# Patient Record
Sex: Male | Born: 1960 | Race: White | Hispanic: No | Marital: Single | Smoking: Current every day smoker
Health system: Southern US, Community
[De-identification: ages and names within clinical notes are randomized; demographics above are authoritative.]

## PROBLEM LIST (undated history)

## (undated) DIAGNOSIS — R109 Unspecified abdominal pain: Secondary | ICD-10-CM

## (undated) DIAGNOSIS — M1990 Unspecified osteoarthritis, unspecified site: Secondary | ICD-10-CM

## (undated) DIAGNOSIS — K859 Acute pancreatitis without necrosis or infection, unspecified: Secondary | ICD-10-CM

## (undated) DIAGNOSIS — G8929 Other chronic pain: Secondary | ICD-10-CM

## (undated) DIAGNOSIS — N289 Disorder of kidney and ureter, unspecified: Secondary | ICD-10-CM

## (undated) DIAGNOSIS — F329 Major depressive disorder, single episode, unspecified: Secondary | ICD-10-CM

## (undated) DIAGNOSIS — F32A Depression, unspecified: Secondary | ICD-10-CM

## (undated) HISTORY — PX: HERNIA REPAIR: SHX51

## (undated) HISTORY — PX: TOTAL KNEE ARTHROPLASTY: SHX125

## (undated) HISTORY — PX: FINGER AMPUTATION: SHX636

## (undated) HISTORY — PX: VARICOSE VEIN SURGERY: SHX832

## (undated) HISTORY — PX: MENISCUS REPAIR: SHX5179

## (undated) HISTORY — PX: KNEE SURGERY: SHX244

## (undated) SURGERY — EGD (ESOPHAGOGASTRODUODENOSCOPY)
Anesthesia: Moderate Sedation

---

## 2004-07-13 ENCOUNTER — Emergency Department (HOSPITAL_COMMUNITY): Admission: EM | Admit: 2004-07-13 | Discharge: 2004-07-13 | Payer: Self-pay | Admitting: Family Medicine

## 2005-05-08 ENCOUNTER — Emergency Department (HOSPITAL_COMMUNITY): Admission: EM | Admit: 2005-05-08 | Discharge: 2005-05-08 | Payer: Self-pay | Admitting: Emergency Medicine

## 2005-05-09 ENCOUNTER — Ambulatory Visit (HOSPITAL_COMMUNITY): Payer: Self-pay | Admitting: Orthopaedic Surgery

## 2005-05-09 ENCOUNTER — Encounter (HOSPITAL_COMMUNITY): Admission: RE | Admit: 2005-05-09 | Discharge: 2005-06-08 | Payer: Self-pay | Admitting: Orthopaedic Surgery

## 2005-05-09 ENCOUNTER — Ambulatory Visit (HOSPITAL_COMMUNITY): Admission: RE | Admit: 2005-05-09 | Discharge: 2005-05-09 | Payer: Self-pay | Admitting: Orthopaedic Surgery

## 2005-09-01 ENCOUNTER — Ambulatory Visit (HOSPITAL_COMMUNITY): Admission: RE | Admit: 2005-09-01 | Discharge: 2005-09-01 | Payer: Self-pay | Admitting: Orthopaedic Surgery

## 2005-09-26 ENCOUNTER — Ambulatory Visit (HOSPITAL_COMMUNITY): Admission: RE | Admit: 2005-09-26 | Discharge: 2005-09-26 | Payer: Self-pay | Admitting: Orthopaedic Surgery

## 2005-10-14 ENCOUNTER — Emergency Department (HOSPITAL_COMMUNITY): Admission: EM | Admit: 2005-10-14 | Discharge: 2005-10-15 | Payer: Self-pay | Admitting: *Deleted

## 2005-10-24 ENCOUNTER — Encounter (INDEPENDENT_AMBULATORY_CARE_PROVIDER_SITE_OTHER): Payer: Self-pay | Admitting: Orthopaedic Surgery

## 2005-10-24 ENCOUNTER — Ambulatory Visit (HOSPITAL_COMMUNITY): Admission: RE | Admit: 2005-10-24 | Discharge: 2005-10-24 | Payer: Self-pay | Admitting: Orthopaedic Surgery

## 2005-11-01 ENCOUNTER — Inpatient Hospital Stay (HOSPITAL_COMMUNITY): Admission: AD | Admit: 2005-11-01 | Discharge: 2005-11-02 | Payer: Self-pay | Admitting: Orthopaedic Surgery

## 2005-11-11 ENCOUNTER — Emergency Department (HOSPITAL_COMMUNITY): Admission: EM | Admit: 2005-11-11 | Discharge: 2005-11-11 | Payer: Self-pay | Admitting: *Deleted

## 2005-11-16 ENCOUNTER — Encounter (INDEPENDENT_AMBULATORY_CARE_PROVIDER_SITE_OTHER): Payer: Self-pay | Admitting: Orthopaedic Surgery

## 2005-11-16 ENCOUNTER — Ambulatory Visit (HOSPITAL_COMMUNITY): Admission: RE | Admit: 2005-11-16 | Discharge: 2005-11-16 | Payer: Self-pay | Admitting: Orthopaedic Surgery

## 2005-11-21 ENCOUNTER — Emergency Department (HOSPITAL_COMMUNITY): Admission: EM | Admit: 2005-11-21 | Discharge: 2005-11-21 | Payer: Self-pay | Admitting: Emergency Medicine

## 2005-11-22 ENCOUNTER — Ambulatory Visit (HOSPITAL_COMMUNITY): Admission: RE | Admit: 2005-11-22 | Discharge: 2005-11-22 | Payer: Self-pay | Admitting: Orthopaedic Surgery

## 2005-11-27 ENCOUNTER — Ambulatory Visit: Payer: Self-pay | Admitting: *Deleted

## 2005-11-27 ENCOUNTER — Emergency Department (HOSPITAL_COMMUNITY): Admission: EM | Admit: 2005-11-27 | Discharge: 2005-11-27 | Payer: Self-pay | Admitting: Emergency Medicine

## 2005-11-29 ENCOUNTER — Ambulatory Visit: Payer: Self-pay | Admitting: *Deleted

## 2005-12-01 ENCOUNTER — Ambulatory Visit: Payer: Self-pay | Admitting: Internal Medicine

## 2005-12-04 ENCOUNTER — Emergency Department (HOSPITAL_COMMUNITY): Admission: EM | Admit: 2005-12-04 | Discharge: 2005-12-04 | Payer: Self-pay | Admitting: Emergency Medicine

## 2005-12-08 ENCOUNTER — Ambulatory Visit: Payer: Self-pay | Admitting: *Deleted

## 2005-12-22 ENCOUNTER — Ambulatory Visit: Payer: Self-pay | Admitting: Cardiology

## 2006-01-08 ENCOUNTER — Ambulatory Visit: Payer: Self-pay | Admitting: *Deleted

## 2006-01-10 ENCOUNTER — Ambulatory Visit: Payer: Self-pay | Admitting: *Deleted

## 2006-01-17 ENCOUNTER — Ambulatory Visit: Payer: Self-pay | Admitting: *Deleted

## 2006-02-07 ENCOUNTER — Ambulatory Visit: Payer: Self-pay | Admitting: Cardiology

## 2006-02-07 ENCOUNTER — Ambulatory Visit (HOSPITAL_COMMUNITY): Admission: RE | Admit: 2006-02-07 | Discharge: 2006-02-07 | Payer: Self-pay | Admitting: Orthopaedic Surgery

## 2006-02-14 ENCOUNTER — Ambulatory Visit: Payer: Self-pay | Admitting: *Deleted

## 2006-02-25 ENCOUNTER — Emergency Department (HOSPITAL_COMMUNITY): Admission: EM | Admit: 2006-02-25 | Discharge: 2006-02-25 | Payer: Self-pay | Admitting: Emergency Medicine

## 2006-02-28 ENCOUNTER — Inpatient Hospital Stay (HOSPITAL_COMMUNITY): Admission: AD | Admit: 2006-02-28 | Discharge: 2006-03-02 | Payer: Self-pay | Admitting: Orthopaedic Surgery

## 2006-03-16 ENCOUNTER — Ambulatory Visit: Payer: Self-pay | Admitting: *Deleted

## 2006-03-20 ENCOUNTER — Emergency Department (HOSPITAL_COMMUNITY): Admission: EM | Admit: 2006-03-20 | Discharge: 2006-03-20 | Payer: Self-pay | Admitting: Emergency Medicine

## 2006-03-28 ENCOUNTER — Ambulatory Visit: Payer: Self-pay | Admitting: *Deleted

## 2006-03-28 ENCOUNTER — Ambulatory Visit: Payer: Self-pay | Admitting: Family Medicine

## 2006-03-29 ENCOUNTER — Emergency Department (HOSPITAL_COMMUNITY): Admission: EM | Admit: 2006-03-29 | Discharge: 2006-03-29 | Payer: Self-pay | Admitting: Emergency Medicine

## 2006-03-30 ENCOUNTER — Ambulatory Visit: Payer: Self-pay | Admitting: Family Medicine

## 2006-04-20 ENCOUNTER — Ambulatory Visit: Payer: Self-pay | Admitting: *Deleted

## 2006-05-02 ENCOUNTER — Emergency Department (HOSPITAL_COMMUNITY): Admission: EM | Admit: 2006-05-02 | Discharge: 2006-05-02 | Payer: Self-pay | Admitting: Emergency Medicine

## 2006-05-09 ENCOUNTER — Emergency Department (HOSPITAL_COMMUNITY): Admission: EM | Admit: 2006-05-09 | Discharge: 2006-05-09 | Payer: Self-pay | Admitting: Emergency Medicine

## 2006-08-26 ENCOUNTER — Emergency Department (HOSPITAL_COMMUNITY): Admission: EM | Admit: 2006-08-26 | Discharge: 2006-08-26 | Payer: Self-pay | Admitting: Pediatric Gastroenterology

## 2006-10-09 IMAGING — CR DG KNEE COMPLETE 4+V*L*
4 series · 4 of 4 positions shown · non-contrast
Comparison: none

CLINICAL DATA: Fell off ladder, back pain, knee pain

LUMBAR SPINE - 4  VIEW:

[view not recorded (1 of 4)]
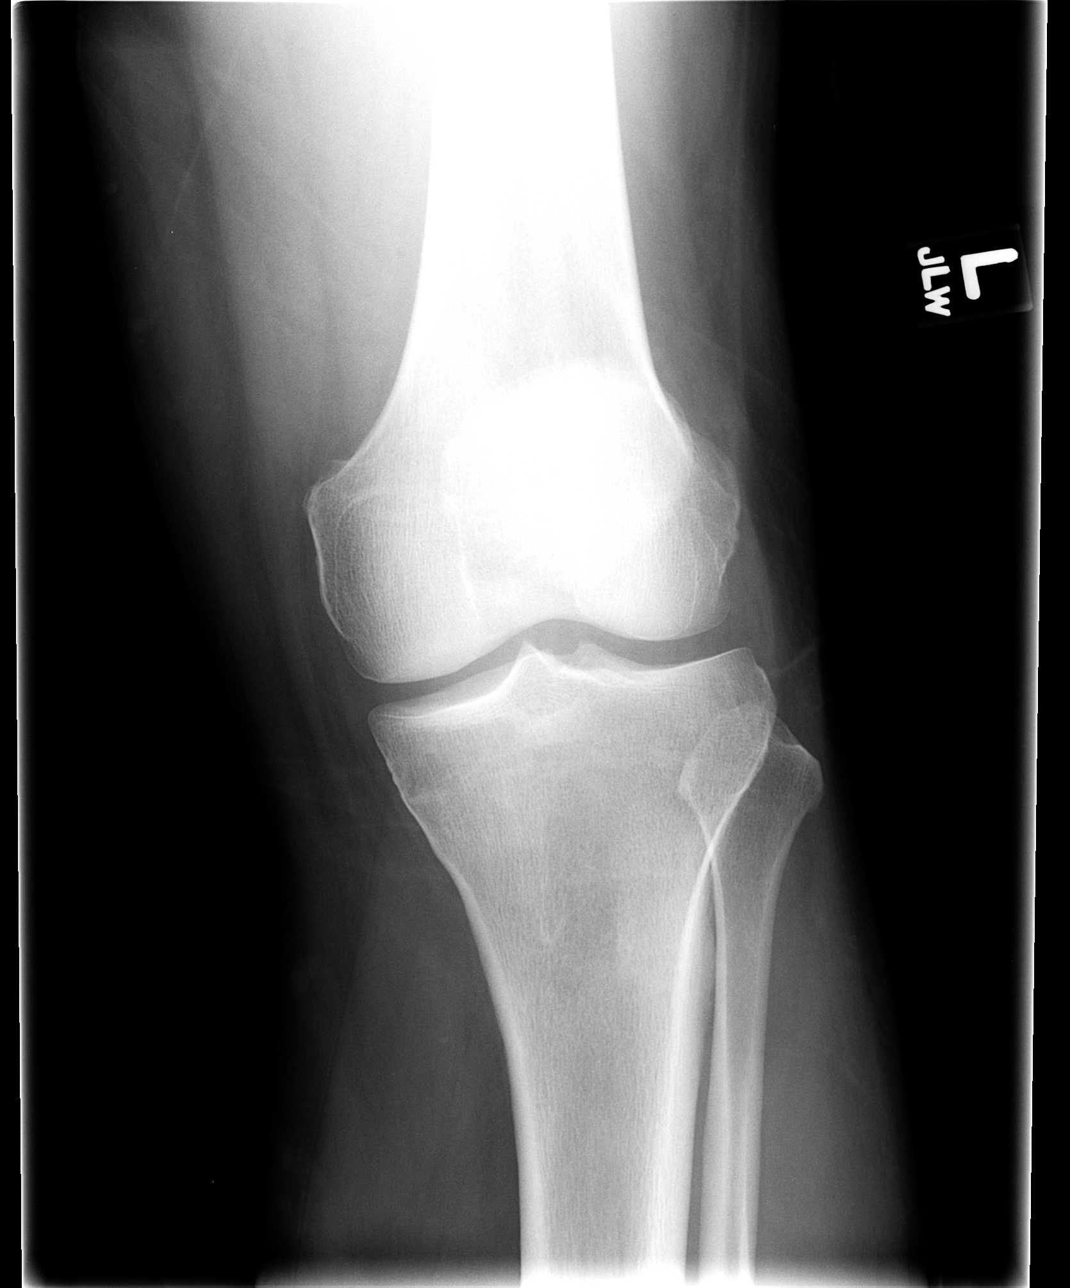

[view not recorded (2 of 4)]
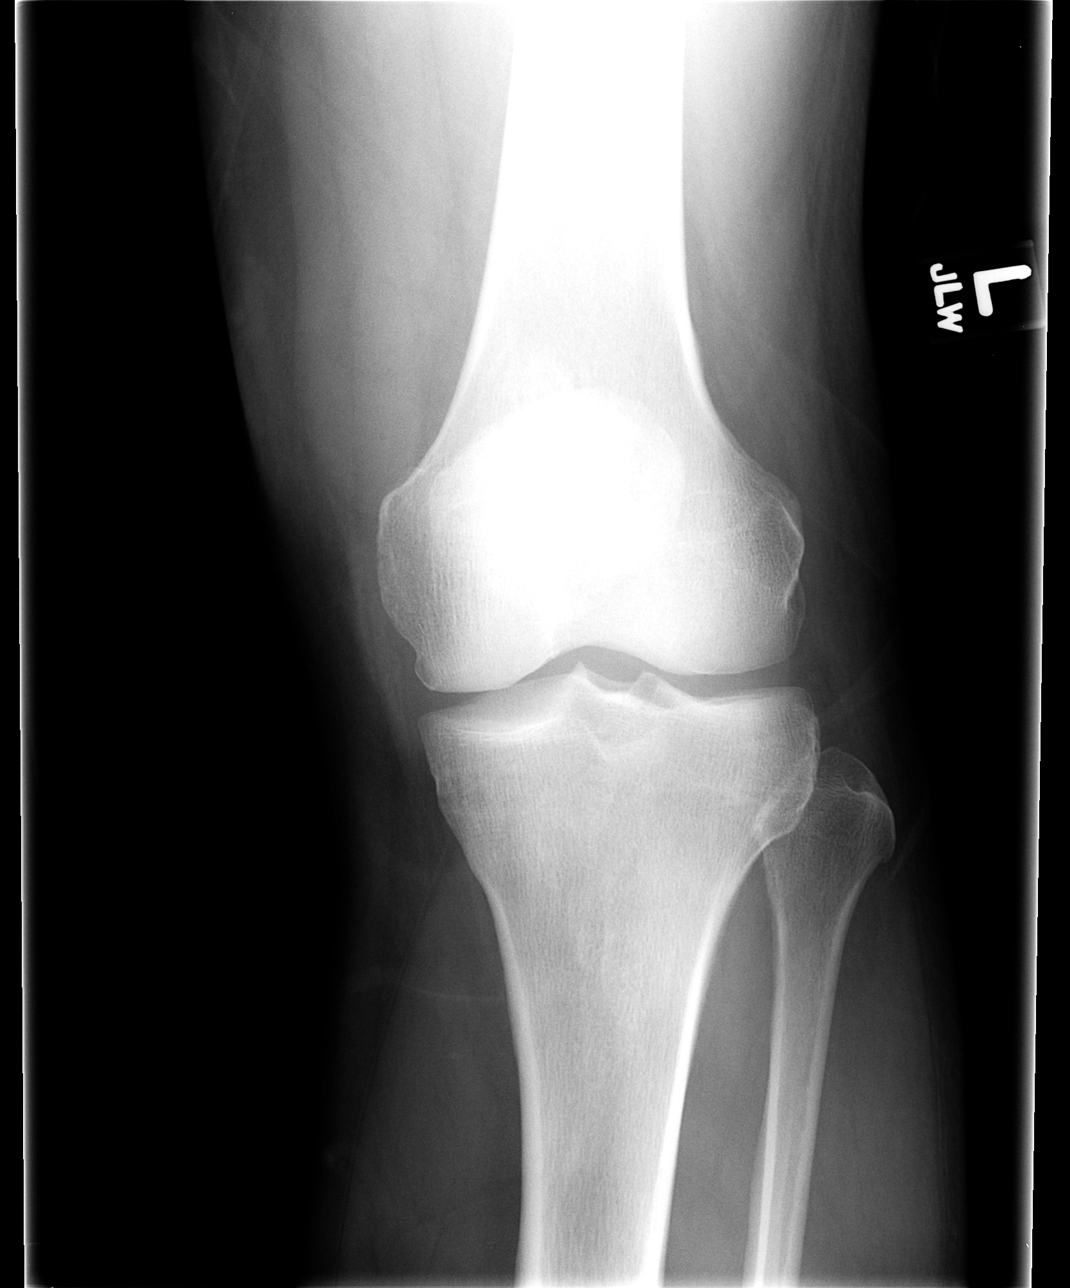

[view not recorded (3 of 4)]
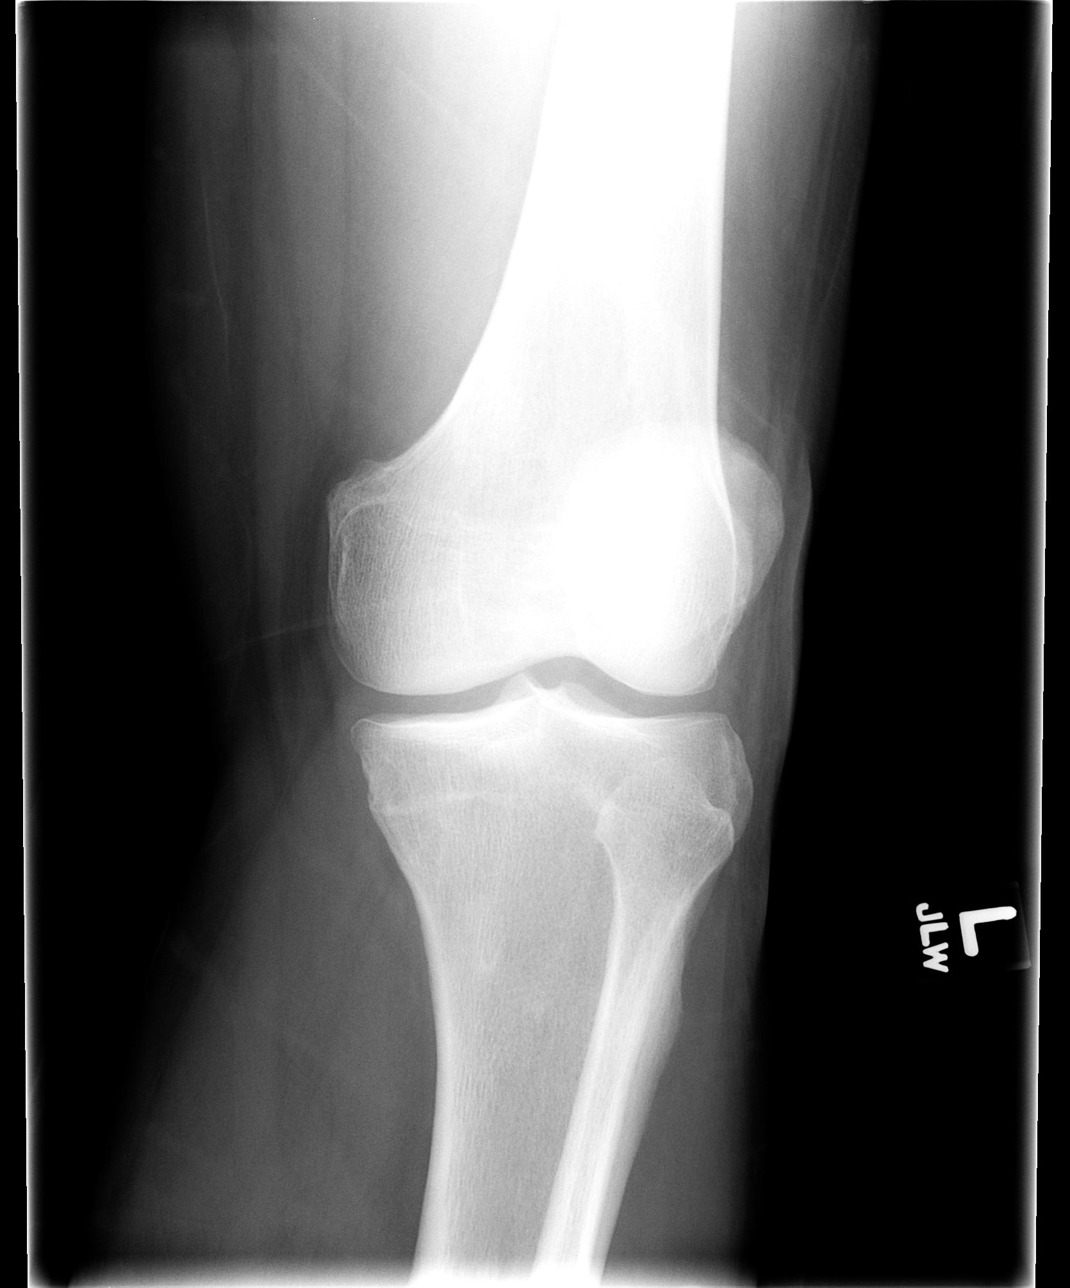

[view not recorded (4 of 4)]
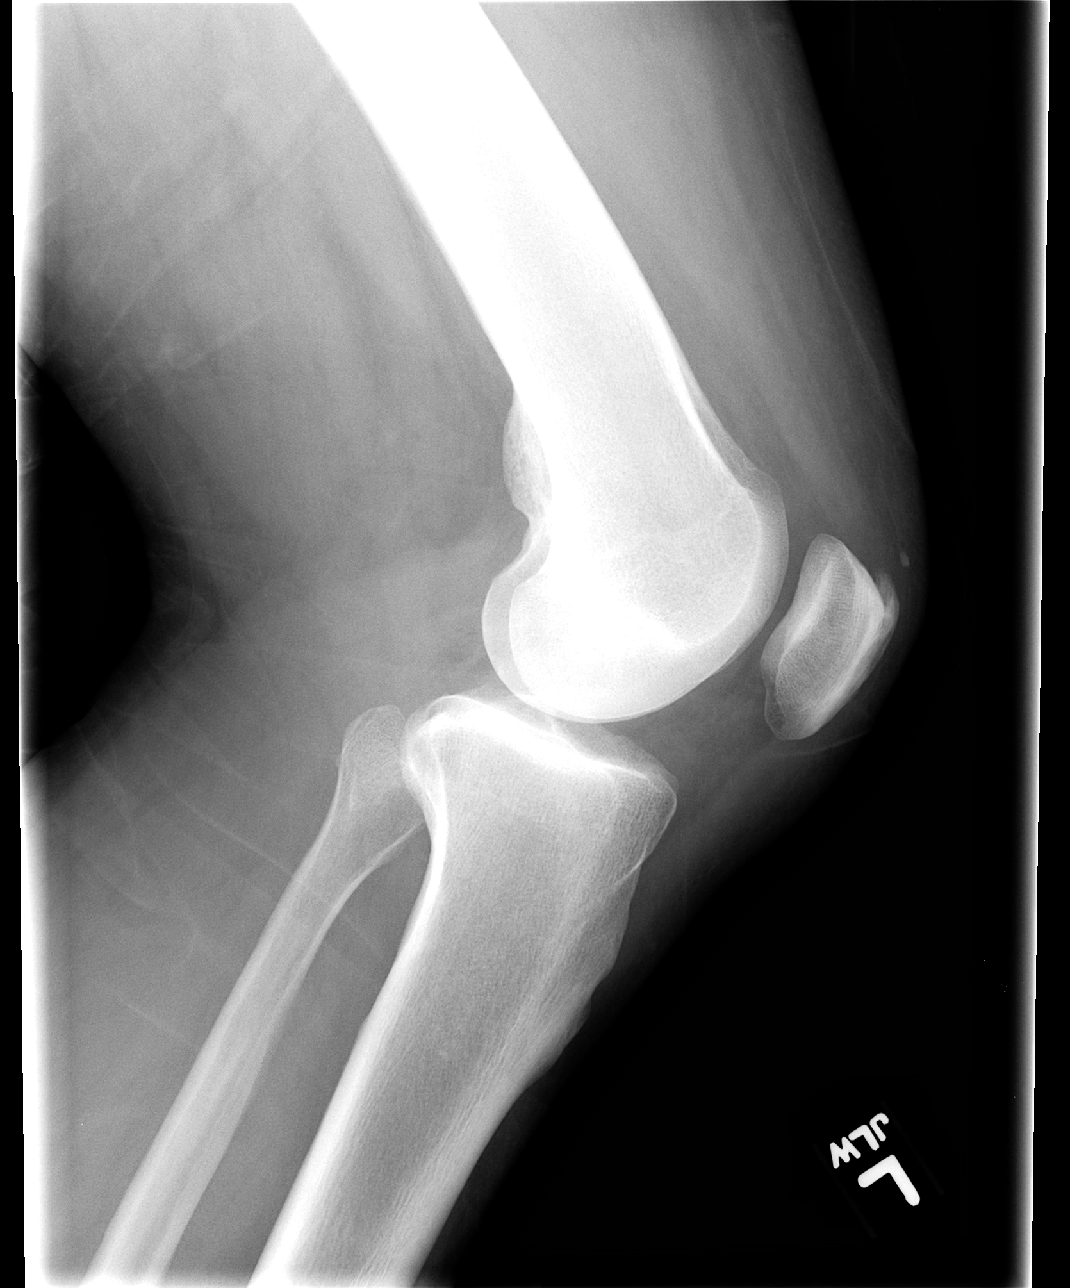

[4 of 4 positions shown; findings below may reference images not displayed]

FINDINGS: There is no evidence of lumbar spine fracture.  Alignment is normal. 
Intervertebral disc spaces are maintained, and no other significant bone
abnormalities are identified.
IMPRESSION: Negative lumbar spine radiographs.

THORACIC SPINE - 2  VIEW:
FINDINGS: There is no evidence of thoracic spine fracture.  Alignment is
normal.  No othersignificant bone abnormalities are identified.
IMPRESSION: Negative thoracic spine radiographs.

RIGHT KNEE - 4 VIEW
FINDINGS: Minimal degenerative changes in the medial compartment. No fracture,
subluxation, or dislocation. No joint effusion.

IMPRESSION

Minimal degenerative changes. No acute findings.

LEFT KNEE - 4 VIEW
FINDINGS: No acute bony abnormality. Specifically, no fracture, subluxation,
dislocation, or joint effusion.

IMPRESSION

No acute findings.

## 2006-10-09 IMAGING — CR DG KNEE COMPLETE 4+V*R*
4 series · 4 of 4 positions shown · non-contrast
Comparison: none

CLINICAL DATA: Fell off ladder, back pain, knee pain

LUMBAR SPINE - 4  VIEW:

[view not recorded (1 of 4)]
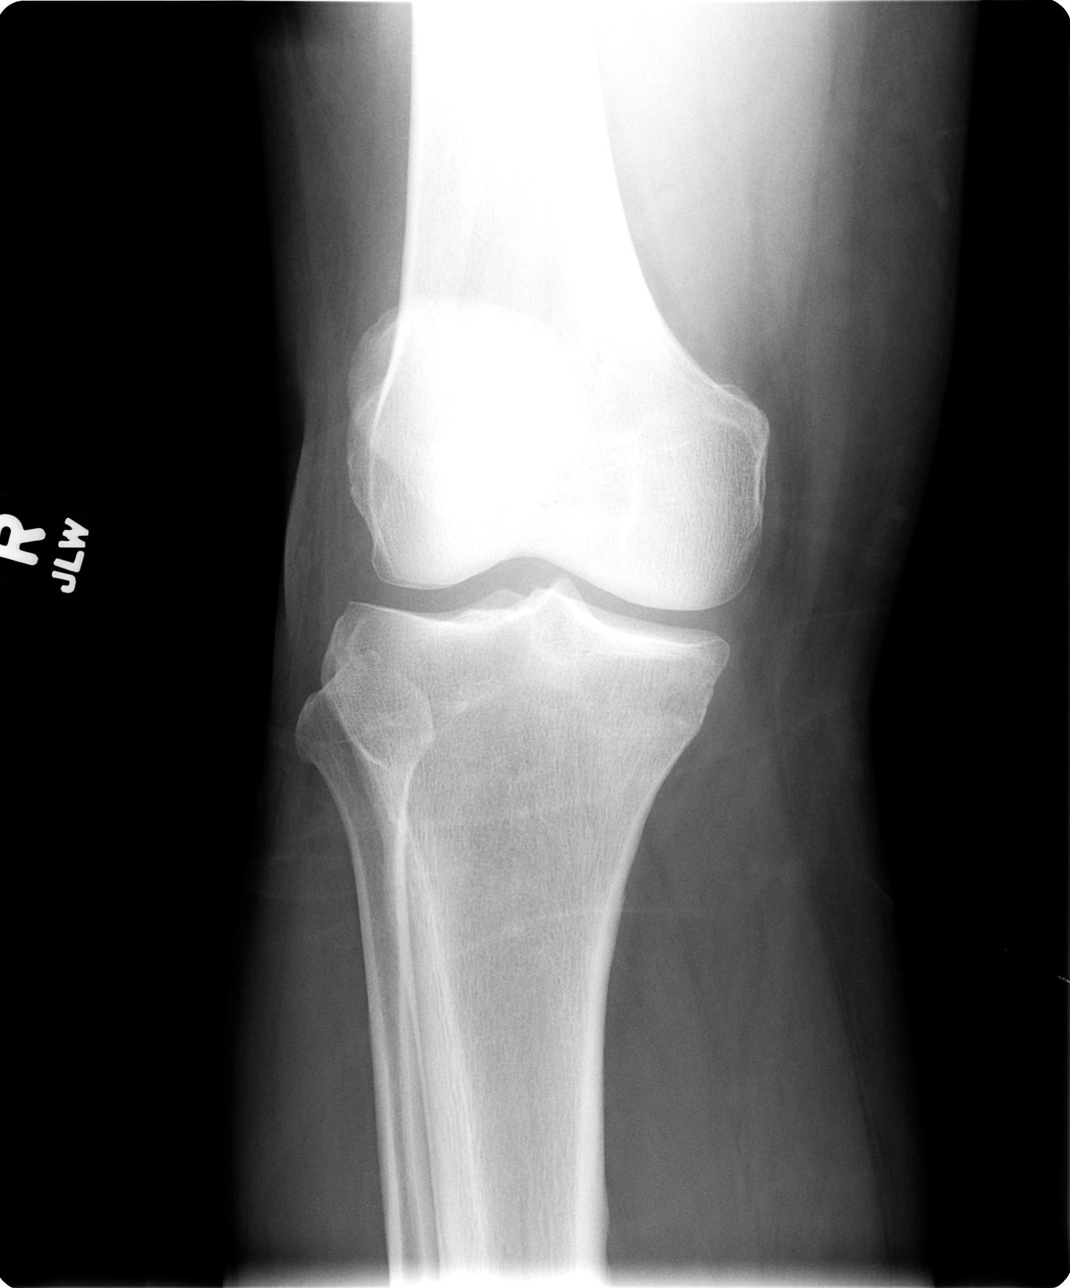

[view not recorded (2 of 4)]
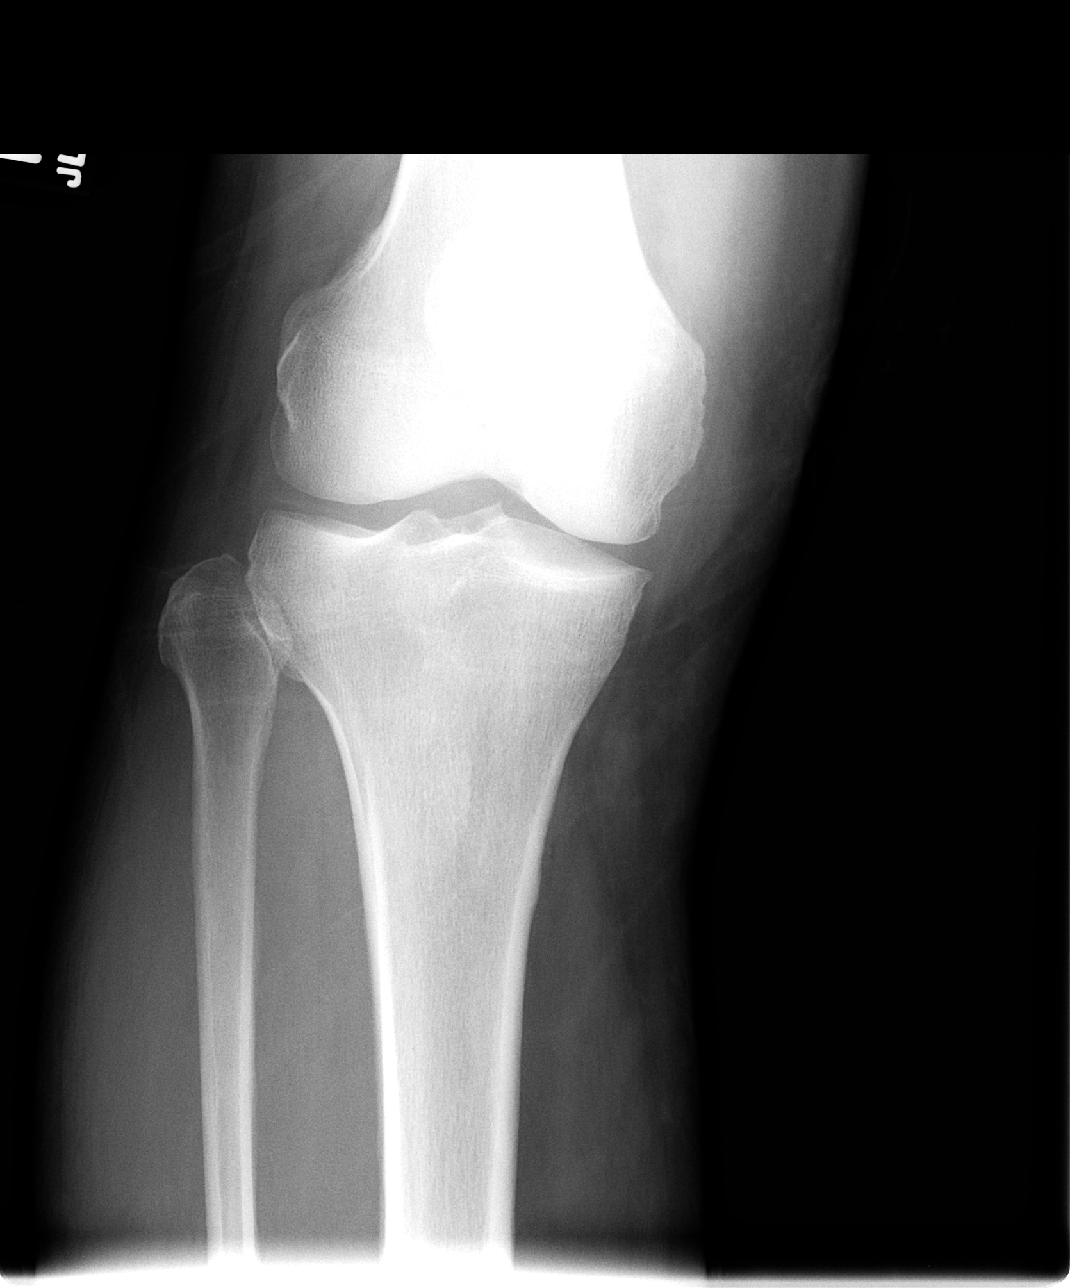

[view not recorded (3 of 4)]
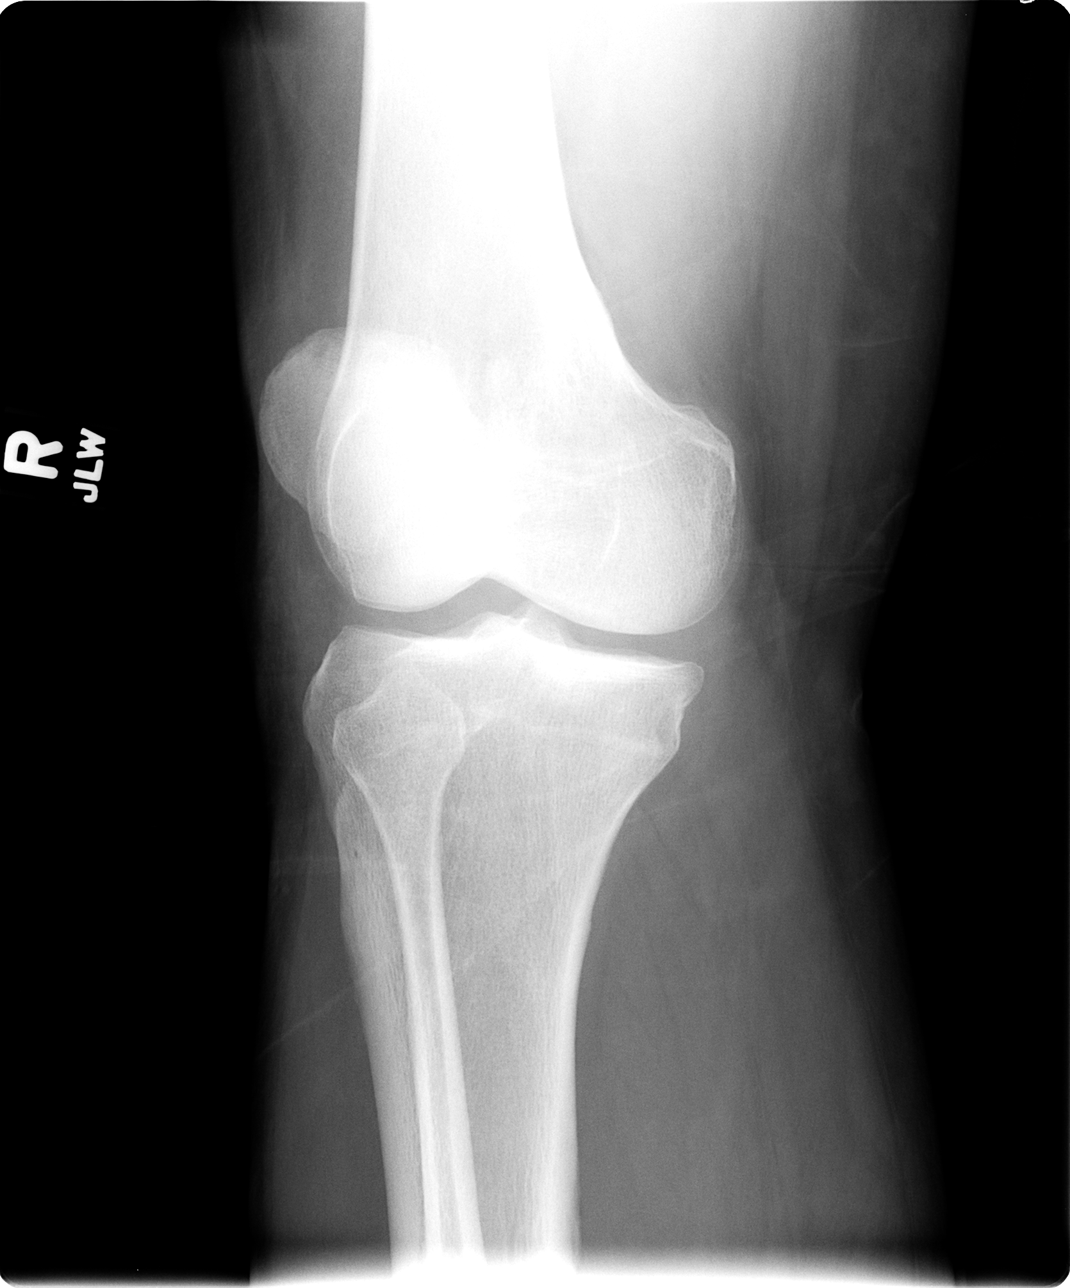

[view not recorded (4 of 4)]
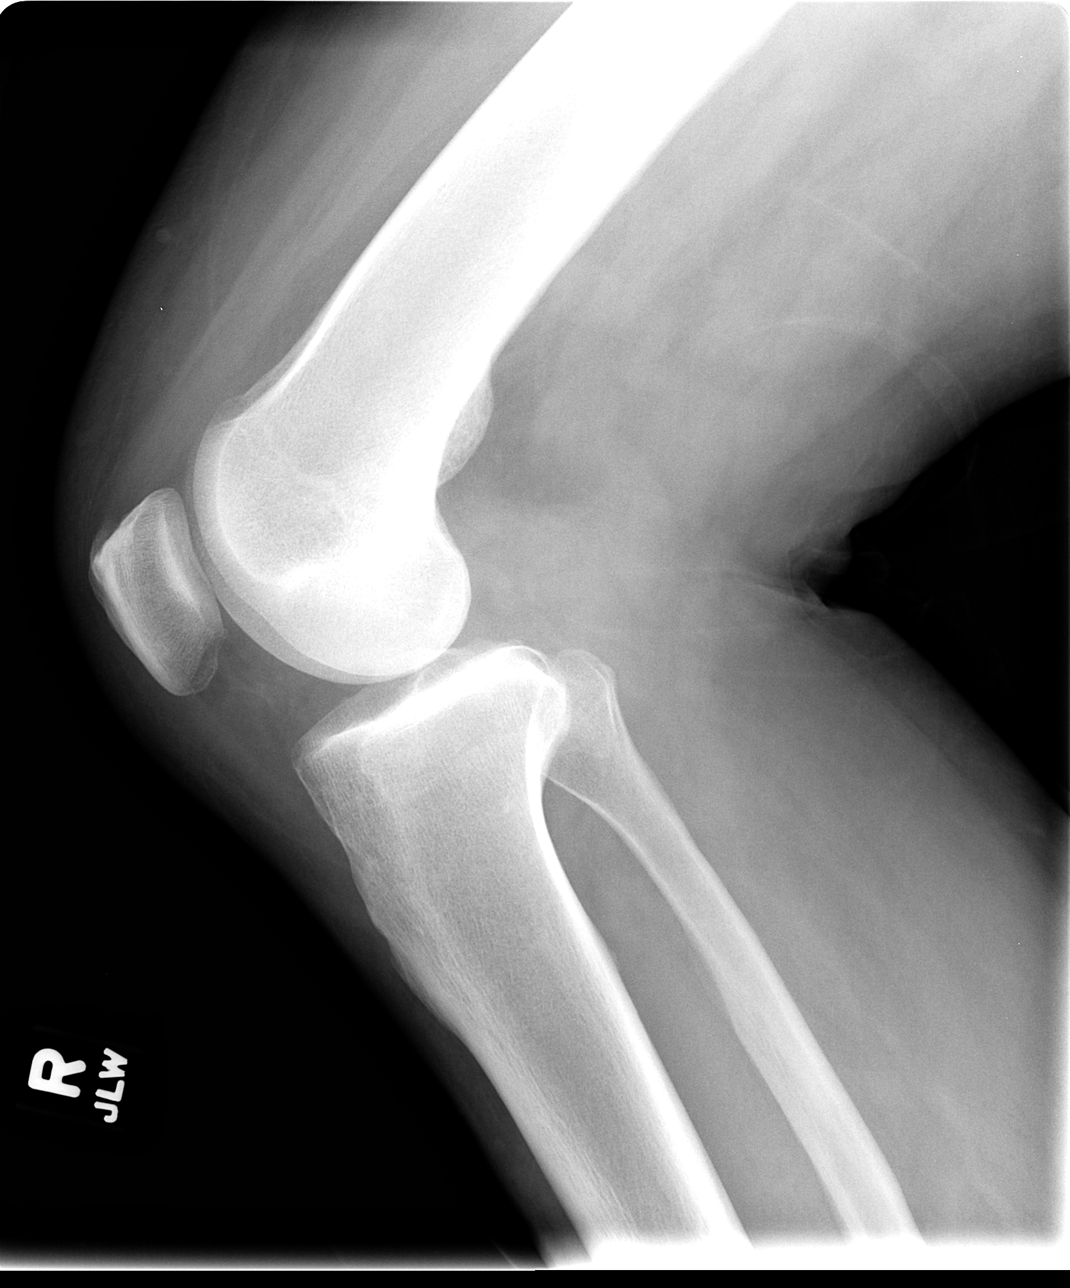

[4 of 4 positions shown; findings below may reference images not displayed]

FINDINGS: There is no evidence of lumbar spine fracture.  Alignment is normal. 
Intervertebral disc spaces are maintained, and no other significant bone
abnormalities are identified.
IMPRESSION: Negative lumbar spine radiographs.

THORACIC SPINE - 2  VIEW:
FINDINGS: There is no evidence of thoracic spine fracture.  Alignment is
normal.  No othersignificant bone abnormalities are identified.
IMPRESSION: Negative thoracic spine radiographs.

RIGHT KNEE - 4 VIEW
FINDINGS: Minimal degenerative changes in the medial compartment. No fracture,
subluxation, or dislocation. No joint effusion.

IMPRESSION

Minimal degenerative changes. No acute findings.

LEFT KNEE - 4 VIEW
FINDINGS: No acute bony abnormality. Specifically, no fracture, subluxation,
dislocation, or joint effusion.

IMPRESSION

No acute findings.

## 2007-01-13 ENCOUNTER — Emergency Department (HOSPITAL_COMMUNITY): Admission: EM | Admit: 2007-01-13 | Discharge: 2007-01-13 | Payer: Self-pay | Admitting: Emergency Medicine

## 2007-02-23 ENCOUNTER — Emergency Department (HOSPITAL_COMMUNITY): Admission: EM | Admit: 2007-02-23 | Discharge: 2007-02-23 | Payer: Self-pay | Admitting: Emergency Medicine

## 2007-04-26 ENCOUNTER — Encounter (INDEPENDENT_AMBULATORY_CARE_PROVIDER_SITE_OTHER): Payer: Self-pay | Admitting: Family Medicine

## 2007-05-12 ENCOUNTER — Emergency Department (HOSPITAL_COMMUNITY): Admission: EM | Admit: 2007-05-12 | Discharge: 2007-05-12 | Payer: Self-pay | Admitting: Emergency Medicine

## 2008-11-29 ENCOUNTER — Emergency Department (HOSPITAL_COMMUNITY): Admission: EM | Admit: 2008-11-29 | Discharge: 2008-11-29 | Payer: Self-pay | Admitting: Emergency Medicine

## 2009-08-26 ENCOUNTER — Encounter (INDEPENDENT_AMBULATORY_CARE_PROVIDER_SITE_OTHER): Payer: Self-pay | Admitting: Family Medicine

## 2009-12-19 ENCOUNTER — Emergency Department (HOSPITAL_COMMUNITY): Admission: EM | Admit: 2009-12-19 | Discharge: 2009-12-19 | Payer: Self-pay | Admitting: Emergency Medicine

## 2009-12-21 ENCOUNTER — Ambulatory Visit (HOSPITAL_COMMUNITY): Admission: RE | Admit: 2009-12-21 | Discharge: 2009-12-21 | Payer: Self-pay | Admitting: Orthopaedic Surgery

## 2010-03-03 ENCOUNTER — Emergency Department (HOSPITAL_COMMUNITY): Admission: EM | Admit: 2010-03-03 | Discharge: 2010-03-03 | Payer: Self-pay | Admitting: Emergency Medicine

## 2010-05-03 ENCOUNTER — Emergency Department (HOSPITAL_COMMUNITY): Admission: EM | Admit: 2010-05-03 | Discharge: 2010-05-03 | Payer: Self-pay | Admitting: Emergency Medicine

## 2010-05-22 ENCOUNTER — Emergency Department (HOSPITAL_COMMUNITY): Admission: EM | Admit: 2010-05-22 | Discharge: 2010-05-22 | Payer: Self-pay | Admitting: Emergency Medicine

## 2010-07-27 ENCOUNTER — Emergency Department (HOSPITAL_COMMUNITY): Admission: EM | Admit: 2010-07-27 | Discharge: 2010-07-27 | Payer: Self-pay | Admitting: Emergency Medicine

## 2010-08-17 ENCOUNTER — Emergency Department (HOSPITAL_COMMUNITY): Admission: EM | Admit: 2010-08-17 | Discharge: 2010-08-17 | Payer: Self-pay | Admitting: Emergency Medicine

## 2010-11-01 ENCOUNTER — Inpatient Hospital Stay (HOSPITAL_COMMUNITY): Admission: EM | Admit: 2010-11-01 | Discharge: 2010-11-02 | Payer: Self-pay | Source: Home / Self Care

## 2010-12-07 ENCOUNTER — Emergency Department (HOSPITAL_COMMUNITY)
Admission: EM | Admit: 2010-12-07 | Discharge: 2010-12-07 | Payer: Self-pay | Source: Home / Self Care | Admitting: Emergency Medicine

## 2010-12-10 ENCOUNTER — Encounter: Payer: Self-pay | Admitting: Orthopaedic Surgery

## 2010-12-11 ENCOUNTER — Encounter: Payer: Self-pay | Admitting: Orthopaedic Surgery

## 2011-01-30 LAB — BASIC METABOLIC PANEL
BUN: 16 mg/dL (ref 6–23)
GFR calc non Af Amer: >60 mL/min (ref >60)
Glucose, Bld: 201 mg/dL — ABNORMAL HIGH (ref 70–99)
Potassium: 4.1 mEq/L (ref 3.5–5.1)
Sodium: 138 mEq/L (ref 135–145)

## 2011-01-30 LAB — RAPID URINE DRUG SCREEN, HOSP PERFORMED
Amphetamines: NOT DETECTED
Barbiturates: NOT DETECTED
Opiates: NOT DETECTED

## 2011-01-30 LAB — CBC
HCT: 39.3 % (ref 39.0–52.0)
Hemoglobin: 13.3 g/dL (ref 13.0–17.0)
MCH: 28.2 pg (ref 26.0–34.0)
MCHC: 33.8 g/dL (ref 30.0–36.0)
MCV: 83.4 fL (ref 78.0–100.0)
RBC: 4.29 MIL/uL (ref 4.22–5.81)
RBC: 4.71 MIL/uL (ref 4.22–5.81)
RDW: 15.3 % (ref 11.5–15.5)
WBC: 8.3 10*3/uL (ref 4.0–10.5)

## 2011-01-30 LAB — URINE CULTURE
Colony Count: NO GROWTH
Culture  Setup Time: 201112140137
Culture: NO GROWTH

## 2011-01-30 LAB — DIFFERENTIAL
Basophils Absolute: 0.1 10*3/uL (ref 0.0–0.1)
Eosinophils Absolute: 0.4 10*3/uL (ref 0.0–0.7)
Eosinophils Absolute: 0.4 10*3/uL (ref 0.0–0.7)
Eosinophils Relative: 5 % (ref 0–5)
Lymphocytes Relative: 34 % (ref 12–46)
Lymphs Abs: 3.3 10*3/uL (ref 0.7–4.0)
Monocytes Absolute: 0.4 10*3/uL (ref 0.1–1.0)
Monocytes Relative: 5 % (ref 3–12)
Neutrophils Relative %: 55 % (ref 43–77)

## 2011-01-30 LAB — URINE MICROSCOPIC-ADD ON: Urine-Other: NONE SEEN

## 2011-01-30 LAB — COMPREHENSIVE METABOLIC PANEL
ALT: 27 U/L (ref 0–53)
AST: 19 U/L (ref 0–37)
Alkaline Phosphatase: 107 U/L (ref 39–117)
CO2: 24 mEq/L (ref 19–32)
Chloride: 101 mEq/L (ref 96–112)
GFR calc Af Amer: >60 mL/min (ref >60)
Total Bilirubin: 0.3 mg/dL (ref 0.3–1.2)

## 2011-01-30 LAB — URINALYSIS, ROUTINE W REFLEX MICROSCOPIC
Bilirubin Urine: NEGATIVE
Glucose, UA: >1000 mg/dL — AB
Hgb urine dipstick: NEGATIVE
Ketones, ur: NEGATIVE mg/dL
Leukocytes, UA: NEGATIVE
Urobilinogen, UA: 0.2 mg/dL (ref 0.0–1.0)

## 2011-01-30 LAB — GLUCOSE, CAPILLARY
Glucose-Capillary: 214 mg/dL — ABNORMAL HIGH (ref 70–99)
Glucose-Capillary: 265 mg/dL — ABNORMAL HIGH (ref 70–99)
Glucose-Capillary: 284 mg/dL — ABNORMAL HIGH (ref 70–99)

## 2011-01-30 LAB — HEMOGLOBIN A1C: Hgb A1c MFr Bld: 8.7 % — ABNORMAL HIGH (ref <5.7)

## 2011-02-02 LAB — GLUCOSE, CAPILLARY: Glucose-Capillary: 196 mg/dL — ABNORMAL HIGH (ref 70–99)

## 2011-04-05 ENCOUNTER — Observation Stay (HOSPITAL_COMMUNITY)
Admission: AD | Admit: 2011-04-05 | Discharge: 2011-04-06 | Disposition: A | Discharge status: Home / Self Care | Payer: BC Managed Care – PPO | Source: Ambulatory Visit | Attending: Internal Medicine | Admitting: Internal Medicine

## 2011-04-05 DIAGNOSIS — F988 Other specified behavioral and emotional disorders with onset usually occurring in childhood and adolescence: Secondary | ICD-10-CM | POA: Insufficient documentation

## 2011-04-05 DIAGNOSIS — IMO0002 Reserved for concepts with insufficient information to code with codable children: Secondary | ICD-10-CM | POA: Insufficient documentation

## 2011-04-05 DIAGNOSIS — Z79899 Other long term (current) drug therapy: Secondary | ICD-10-CM | POA: Insufficient documentation

## 2011-04-05 DIAGNOSIS — F17200 Nicotine dependence, unspecified, uncomplicated: Secondary | ICD-10-CM | POA: Insufficient documentation

## 2011-04-05 DIAGNOSIS — E119 Type 2 diabetes mellitus without complications: Secondary | ICD-10-CM | POA: Insufficient documentation

## 2011-04-05 DIAGNOSIS — I10 Essential (primary) hypertension: Secondary | ICD-10-CM | POA: Insufficient documentation

## 2011-04-05 DIAGNOSIS — F319 Bipolar disorder, unspecified: Secondary | ICD-10-CM | POA: Insufficient documentation

## 2011-04-05 DIAGNOSIS — G4700 Insomnia, unspecified: Principal | ICD-10-CM | POA: Insufficient documentation

## 2011-04-05 DIAGNOSIS — M7989 Other specified soft tissue disorders: Secondary | ICD-10-CM | POA: Insufficient documentation

## 2011-04-05 DIAGNOSIS — M1710 Unilateral primary osteoarthritis, unspecified knee: Secondary | ICD-10-CM | POA: Insufficient documentation

## 2011-04-05 LAB — RAPID URINE DRUG SCREEN, HOSP PERFORMED
Amphetamines: POSITIVE — AB
Barbiturates: NOT DETECTED
Benzodiazepines: NOT DETECTED
Cocaine: NOT DETECTED
Opiates: NOT DETECTED

## 2011-04-06 ENCOUNTER — Observation Stay (HOSPITAL_COMMUNITY): Payer: BC Managed Care – PPO

## 2011-04-06 LAB — URINALYSIS, ROUTINE W REFLEX MICROSCOPIC
Bilirubin Urine: NEGATIVE
Glucose, UA: 500 mg/dL — AB
Ketones, ur: NEGATIVE mg/dL
Protein, ur: NEGATIVE mg/dL
Urobilinogen, UA: 0.2 mg/dL (ref 0.0–1.0)

## 2011-04-06 LAB — D-DIMER, QUANTITATIVE: D-Dimer, Quant: 1.31 ug/mL-FEU — ABNORMAL HIGH (ref 0.00–0.48)

## 2011-04-06 LAB — BASIC METABOLIC PANEL
CO2: 30 mEq/L (ref 19–32)
CO2: 29 mEq/L (ref 19–32)
Calcium: 9.3 mg/dL (ref 8.4–10.5)
Calcium: 9.1 mg/dL (ref 8.4–10.5)
Creatinine, Ser: 0.55 mg/dL (ref 0.4–1.5)
GFR calc Af Amer: >60 mL/min (ref >60)
GFR calc Af Amer: >60 mL/min (ref >60)
GFR calc non Af Amer: >60 mL/min (ref >60)
Potassium: 3.4 mEq/L — ABNORMAL LOW (ref 3.5–5.1)
Sodium: 136 mEq/L (ref 135–145)

## 2011-04-06 LAB — DIFFERENTIAL
Basophils Relative: 1 % (ref 0–1)
Eosinophils Absolute: 0.2 10*3/uL (ref 0.0–0.7)
Eosinophils Absolute: 0.2 10*3/uL (ref 0.0–0.7)
Lymphs Abs: 1.6 10*3/uL (ref 0.7–4.0)
Monocytes Absolute: 0.4 10*3/uL (ref 0.1–1.0)
Monocytes Relative: 8 % (ref 3–12)
Neutro Abs: 3.2 10*3/uL (ref 1.7–7.7)
Neutrophils Relative %: 59 % (ref 43–77)

## 2011-04-06 LAB — CBC
Hemoglobin: 11.9 g/dL — ABNORMAL LOW (ref 13.0–17.0)
MCH: 27.6 pg (ref 26.0–34.0)
MCHC: 32.5 g/dL (ref 30.0–36.0)
MCV: 83.5 fL (ref 78.0–100.0)
Platelets: 202 10*3/uL (ref 150–400)
Platelets: 176 10*3/uL (ref 150–400)
RBC: 4.3 MIL/uL (ref 4.22–5.81)
RDW: 15.6 % — ABNORMAL HIGH (ref 11.5–15.5)
WBC: 5.4 10*3/uL (ref 4.0–10.5)

## 2011-04-06 LAB — GLUCOSE, CAPILLARY
Glucose-Capillary: 203 mg/dL — ABNORMAL HIGH (ref 70–99)
Glucose-Capillary: 127 mg/dL — ABNORMAL HIGH (ref 70–99)
Glucose-Capillary: 140 mg/dL — ABNORMAL HIGH (ref 70–99)

## 2011-04-06 LAB — T4, FREE: Free T4: 1.02 ng/dL (ref 0.80–1.80)

## 2011-04-06 LAB — URINE MICROSCOPIC-ADD ON

## 2011-04-06 LAB — ACETAMINOPHEN LEVEL: Acetaminophen (Tylenol), Serum: <15 ug/mL (ref 10–30)

## 2011-04-07 NOTE — H&P (Signed)
NAMERUTHVIK, BARNABY NO.:  [id number]  MEDICAL RECORD NO.:  [id number]           PATIENT TYPE:  LOCATION:                                 FACILITY:  PHYSICIAN:  Vania Rea, M.D. DATE OF BIRTH:  Mar 28, 1961  DATE OF ADMISSION:  04/06/2011 DATE OF DISCHARGE:  LH                             HISTORY & PHYSICAL   PRIMARY CARE PHYSICIAN:  Dr. Joya Salm in [place], [place].  ORTHOPEDIC SURGEON:  Dr. Darreld Mclean.  CHIEF COMPLAINT:  Inability to sleep for 3 days.  HISTORY OF PRESENT ILLNESS:  This is a 50 year old Caucasian gentleman with a history of attention deficit disorder and bipolar disorder, who is maintained on multiple psychotropic medications including Adderall, Zoloft, Ativan, and who also has chronic right knee pain associated with a complicated orthopedic procedure several years ago, for which he has recently been taking OxyContin, and who does have episodic periods of prolonged insomnia, which lead to a psychotic breakdown unless he is able to get some sleep.  The patient apparently visited his orthopedic surgeon about 3 days ago and received a steroid injection into his right knee;  it is unclear if this was a precipitating cause, but the patient has not slept since then.  He has been very agitated, pacing, and having difficulty focusing and in fact went to see his personal psychiatrist in [place] yesterday and was prescribed Ativan which has not helped.  The patient presented to the emergency room last night, complaining of insomnia and agitation and his wife reports he is difficult to control at home, and the emergency room physician assessed him as being unsafe to go home, and given him an injection of Geodon and has requested the Hospitalist Service to assist with management.  The patient denies any fever, cough, or cold.  Denies any chest pains or shortness of breath.  He denies any visual or auditory hallucinations. New  problem, his wife is now left and is on her way home.  She is not available to assist with a history and this gentleman's personal history is sketchy because he is so agitated that he has difficulty remembering dates.  PAST MEDICAL HISTORY: 1. Diabetes. 2. Past history of hypertension, resolved with significant deliberate     weight loss. 3. Bipolar disorder. 4. Attention deficit disorder. 5. Osteoarthritis. 6. Migraines.  PAST SURGICAL HISTORY:  Includes: 1. Right knee replacement, which became unstable and had to be     adjusted some years later on. 2. History of vein stripping. 3. History of hernia repair. 4. 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  MEDICATIONS:  Include, Lantus, sliding scale NovoLog, Zoloft, metformin, Ativan, Adderall, slow release and long-acting, OxyContin.  The patient is too agitated to give doses.  ALLERGIES:  CIPRO causes nausea and vomiting.  There is questionable history of allergy to OXYCONTIN.  SOCIAL HISTORY:  Lives with his wife.  Smokes about one and a half packs of cigarettes per day.  Denies alcohol or illicit drug use.  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  FAMILY HISTORY:  He denies any medical problems in his immediate family.  REVIEW OF SYSTEMS: 1. Significant for a 64 llb weight loss due to deliberate diet and     exercise, which has normalized his blood pressure and he apparently     has needed no blood pressure medications since then. 2. The patient also has a history of accidental amputation of fingers     of his right hand in a work in Insurance claims handler saw, some years ago, but     continues to work as a Location manager while taking multiple     psychotropic drugs. 3. The patient is complaining of nervousness, agitation, and     difficulty concentrating. 4. He gives a history of benign cardiac murmur. 5. Right lower extremity swelling,  worse for the past few days.  PHYSICAL EXAM:  GENERAL:  Agitated and tremulous middle-aged Caucasian gentleman, standing up in the room. VITAL SIGNS:  His temperature is 97.8, pulse 100, respirations 16, blood pressure 146/73, saturating at 94% on room air. HEENT:  His pupils are round and equal.  Mucous membranes pink, anicteric.  No cervical lymphadenopathy.  No thyromegaly.  No carotid bruits. CHEST:  Clear to auscultation bilaterally. CARDIOVASCULAR SYSTEM:  He has a regular rhythm.  He has a 3/6 systolic murmur heard over his precordium. ABDOMEN:  Soft and nontender.  No masses. EXTREMITIES:  He has 1+ soft pitting edema of the right leg, trace edema of the left leg.  He has mildly swollen right knee.  Dorsalis pedis pulses are 2+ bilaterally. CENTRAL NERVOUS SYSTEM:  Cranial nerves II-XII are grossly intact.  He has a fine generalized tremor.  No focal lateralizing signs.  LABS:  White count is 5.4, hemoglobin is 11.9, platelets 202.  He has a normal differential.  His sodium is 136, potassium 3.4, chloride 101, CO2 30, glucose 182, BUN 14, creatinine 0.64.  His calcium is 9.3.  His alcohol level is undetectable.  Acetaminophen level undetectable. Salicylate level is moderately elevated at 10.8.  His urinalysis shows clear urine, specific gravity 1.025, glucose 500, trace of blood, negative for proteins, ketones, nitrite, and leukocyte esterase negative.  Microscopy shows contamination with a few squamous cells, 3-6 red cells, rare bacteria.  ASSESSMENT: 1. Intractable insomnia in a gentleman with history of psychosis. 2. Diabetes type 2, apparently controlled. 3. Hypertension, controlled. 4. Bipolar disorder. 5. Attention deficit disorder. 6. Arthritis. 7. Chronic osteoarthritis of the right knee. 8. Right lower extremity swelling, worse for the past few days. 9. Tobacco abuse.  PLAN: 1. Because of the risks of insomnia in this gentleman with a history     of  psychosis, I agree with bringing him in for observation. Now     that he has gotten some Geodon and he can likely be discharged home     within the next 12 hours to follow up with his primary     psychiatrist.  If his insomnia is not resolved, he may benefit from     a psychiatric evaluation. 2. This patient may also benefit from some nonpharmacological approach     to his mood disorder and sleep abnormality as he seems to be     surviving on a "uppers and downers." 3. For his diabetes, we will place him on a sliding scale until we     know the exact doses of his medications and he is back on his     normal diet.  4. We will resume his psychotropic medications once we know the true     doses. 5. We will give him a nicotine patch for his nicotine addiction. 6. Because of the recent onset of right lower extremity swelling, we     will get a D-dimer, and if elevated, we will get lower extremity     Dopplers in the morning, but for the time being we will just give     prophylactic anticoagulation. 7. Other plans as per orders.     Vania Rea, M.D.     LC/MEDQ  D:  04/06/2011  T:  04/06/2011  Job:  [id number]  cc:   Joya Salm Fax: [phone]  J. Darreld Mclean, M.D. Fax: [phone]  Electronically Signed by Vania Rea M.D. on 04/07/2011 08:39:02 AM

## 2011-04-07 NOTE — H&P (Signed)
[name], [name]                ACCOUNT NO.:  [id number]   MEDICAL RECORD NO.:  [id number]          PATIENT TYPE:  INP   LOCATION:  A332                          FACILITY:  [facility]   PHYSICIAN:  J. Darreld Mclean, M.D. DATE OF BIRTH:  08-24-1961   DATE OF ADMISSION:  02/28/2006  DATE OF DISCHARGE:  LH                                HISTORY & PHYSICAL   CHIEF COMPLAINT:  My back hurts.  My knee hurts.   The patient is a 50 year old male with an acute episode of low back pain  this past weekend.  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  He was  showing them how to hit a ground ball, and he fell backwards and hurt his  back and twisted his right knee.  I did arthroscopy of his right knee,  November 16, 2005.  He previously had a meniscectomy of the left knee by me  on October 24, 2005.  Post-op, he developed a DVT on the right, and he is on  Coumadin.  The patient has a long history of chronic pain.  It has been well  controlled with Percocet 7.5/325.  I saw him in the office two days ago for  acute pain to his back on February 26, 2006.  I recommended continuing the  Percocet, rest, physical therapy.  The pain has continued and it has gotten  much worse.  He has got spasms to his back.  He is unable to pain control by  the Percocet.  Percocet has controlled his pain.  He has a long history of  chronic pain.  He has been seen in the pain clinic in [place], and he has  been seen at other places.  I have been able to control his pain now with  Percocet rather well.  __________ markedly increased pain to his right knee.  Recommend hospitalization for pain control.  He had no other injuries.   The patient smokes.   He has no allergies.   He has:  1.  Hypertension.  2.  Diabetes.   He is taking medication for his hypertension and his diabetes but he did not  bring the medicines with him and his wife will go home and get those.   He smokes a pack to a half a pack of cigarettes per  day.  He does not use  alcoholic beverages.   He has taken diazepam in the past for his spasms.   PREVIOUS SURGERIES:  1.  Hand surgery in 1983.  2.  Hernia surgery in 1994.  3.  Vein stripping in the right leg, 2001.  4.  Recent knee arthroscopy as stated.   He works for [employer].  He is married and lives in [place].   PHYSICAL EXAMINATION:  VITAL SIGNS:  Normal limits.  He is 5 foot 9 inches,  weighs 210 pounds.  GENERAL:  He is alert, cooperative, oriented.  HEENT:  Negative.  NECK:  Supple.  LUNGS:  Clear to P&A.  HEART:  Regular rate and rhythm without murmur heard.  ABDOMEN:  Soft, nontender without masses.  BACK:  Low back pain is markedly tender.  He has got some spasm and the  lower back is very, very tight.  Range of motion is markedly limited.  His  posture, straight leg raising, reflexes are normal.  EXTREMITIES:  He has got some mild edema to the right lower leg that has  been present since his DVT.  He has got a mild effusion to the right knee.  Left knee, there is no effusion.  Left knee has no pain.  Right knee has  pain with tenderness but the knee is stable.  Other extremities are normal.  CNS:  Intact.  SKIN:  Intact.   IMPRESSION:  1.  Acute low back pain.  2.  Right knee pain.  3.  History of recent deep vein thrombosis, right leg, on Coumadin.  4.  Status post bilateral knee arthroscopies in December 2006.  5.  Chronic pain syndrome.   1.  The patient is admitted for pain control as stated.  2.  We will continue the Coumadin.  3.  His wife will get his medicines from home and resume those.  4.  Physical therapy will see him tomorrow.  5.  Mainly bed rest today.                                            ______________________________  J. Darreld Mclean, M.D.     JWK/MEDQ  D:  02/28/2006  T:  02/28/2006  Job:  [id number]

## 2011-04-07 NOTE — Discharge Summary (Signed)
[name], [name]                ACCOUNT NO.:  [id number]   MEDICAL RECORD NO.:  [id number]          PATIENT TYPE:  INP   LOCATION:  A310                          FACILITY:  [facility]   PHYSICIAN:  J. Darreld Mclean, M.D. DATE OF BIRTH:  09-Apr-1961   DATE OF ADMISSION:  11/01/2005  DATE OF DISCHARGE:  12/14/2006LH                                 DISCHARGE SUMMARY   DISCHARGE DIAGNOSIS:  Acute and chronic low back pain.   DISCHARGE STATUS:  Prognosis is good.   DISPOSITION:  Home.   DISCHARGE MEDICATIONS:  Percocet 7.5.   The patient will be seen in my office on Monday, the 18.   The patient recently had arthroscopy of his knee on the left.  He was  carrying in some wood.  He fell and hurt his knee, but particularly hurt his  back.  He has a long history of chronic ongoing pain to his back and has  been seen at the pain clinic.  Acute episode of pain uncontrolled by any  other medication.  Saw him in the office, had significant spasms and  tenderness and recommended to be admitted for overnight observation and pain  control.  Began him on PC IV morphine.  He took this.  Pain was controlled.  We kept him in overnight.  He did well, and his spasms resolved. His pain  resolved.  Felt he was safe to go home and resume his Percocet.  Precautions  were discussed.  I told him to very careful with any kind of lifting or  bending.  He understands this.  Have him keep his appointment on December  18.  Labs otherwise, normal.                                            ______________________________  Shela Commons. Darreld Mclean, M.D.     JWK/MEDQ  D:  11/30/2005  T:  11/30/2005  Job:  [id number]

## 2011-04-07 NOTE — H&P (Signed)
[name], [name]                ACCOUNT NO.:  [id number]   MEDICAL RECORD NO.:  [id number]          PATIENT TYPE:  AMB   LOCATION:  DAY                           FACILITY:  [facility]   PHYSICIAN:  J. Darreld Mclean, M.D. DATE OF BIRTH:  1961/10/06   DATE OF ADMISSION:  11/06/2005  DATE OF DISCHARGE:  LH                                HISTORY & PHYSICAL   CHIEF COMPLAINT:  My right knee hurts, my left knee has done well and I  want to go ahead and have surgery on my right knee.   HISTORY OF PRESENT ILLNESS:  The patient is a 50 year old male who has had  recent arthroscopy of his left knee for torn medial meniscus.  He also has a  torn meniscus on the right knee demonstrated by an MRI.  He has done well  with the left knee except for an episode of severe pain after a fall when he  was carrying some wood.  He also reinjured his back.  He has a long chronic  pain syndrome.   He wants to undergo right knee surgery.  He wants to get this done by the  end of the year.  He understands the procedure having undergone recent  arthroscopy of the left knee on December 5.   His history and physical is basically unchanged by the one dictated on  December 5, and also the one dictated on December 13.   SOCIAL HISTORY:  The patient smokes.   PHYSICAL EXAMINATION:  VITAL SIGNS:  Stable.  GENERAL:  The patient is alert and cooperative.  HEENT:  Negative.  NECK:  Supple.  LUNGS:  Clear to P&A.  HEART:  Regular rate and rhythm without murmurs.  ABDOMEN:  Soft and nontender without masses.  EXTREMITIES:  Left lower extremity has recent surgical scars.  He has a mild  effusion with a range of motion from 0-120.  Right knee has pain and  tenderness medially with a positive McMurray sign.  He has crepitus and  slight effusion to the right knee.  He has chronic back pain with straight  leg raising, positive bilaterally.  NEUROLOGIC:  Gait is stable and uses a cane.  Neurologically intact.  SKIN:   Intact.   IMPRESSION:  1.  Tear of right knee medial meniscus.  2.  Status post recent left knee medial meniscectomy and arthroscopy.  3.  History of chronic back pain.  4.  History of chronic pain syndrome.   PLAN:  Arthroscopy of the right knee.  Labs are pending.  The patient  understands the procedure having undergone the procedure recently.                                            ______________________________  J. Darreld Mclean, M.D.     JWK/MEDQ  D:  11/06/2005  T:  11/06/2005  Job:  [id number]

## 2011-04-07 NOTE — H&P (Signed)
NAMEHARMAN, LANGHANS                ACCOUNT NO.:  [id number]   MEDICAL RECORD NO.:  [id number]          PATIENT TYPE:  INP   LOCATION:  A310                          FACILITY:  [facility]   PHYSICIAN:  J. Darreld Mclean, M.D. DATE OF BIRTH:  1961/04/02   DATE OF ADMISSION:  11/01/2005  DATE OF DISCHARGE:  LH                                HISTORY & PHYSICAL   CHIEF COMPLAINT:  I fell last night, hurt my knee.  My knee is killing me,  my back is killing me.  I can't take the pain.   HISTORY OF PRESENT ILLNESS:  The patient has a recent arthroscopy of his  left knee on October 24, 2005.  He tolerated the procedure well.  This is an  update to the History and Physical previously dictated.  The patient  tolerated the procedure well and did well from arthroscopy of December 5.  I  saw him in the office, however, on December 8, complaining of increased pain  and tenderness.  He was taking Percocet 7.5, and says it was not helping his  pain as much and caused him diarrhea or abdominal discomfort.  He wanted  Lortab 10.  We talked about this and we changed him to Lortab 10.  He did  well over the weekend.  He did well yesterday last evening he went to get a  load of wood and bring into his house.  He says he thought about it, thought  he could do it.  He was bringing the wood in.  He slipped and fell while  carrying the wood, landed on his left knee, rolled and landed and hit his  back.  Had history of long standing chronic back pain.  He has been followed  in the Pain Clinic in [place], released last month.  Lortab 10 last night  did not help his pain.  He has increasing pain and tenderness in his back  and knee.  His knee has tenderness, but he is using a cane to ambulate.  It  hurts.  No head injury or no other injury.  Causes acute episodic pain, not  responding to his oral medications.  On significant doses.  Recommended  overnight admission and observation for pain control.  Put him on IV  morphine PCA.  I think this would be the best way to handle the pain since  he has a long history of chronic pain syndrome.   His History and Physical is not changed.  Past history and all from the one  dictated on October 23, 2005.  This is an update.   He does smoke and I told him he cannot smoke in the hospital.   PHYSICAL EXAMINATION:  GENERAL APPEARANCE:  The patient is alert,  cooperative and oriented.  HEENT:  Negative.  NECK:  Supple.  LUNGS:  Clear to P&A.  HEART:  Murmur not heard.  ABDOMEN:  Soft, nontender without masses.  EXTREMITIES:  Left lower extremity:  Sutures were removed from the left knee  wound.  It has some slightly swelling to the knee and some  slight  ecchymosis.  Range of motion from 0 degrees to 90 degrees with some  tenderness and pain.  His knee is diffusely tender.  Back is extremely  tender, and he has tightness and mild spasm.  NEUROLOGICAL:  Intact.  Straight leg raising positive bilaterally.  CNS:  Intact.  SKIN:  Intact.   IMPRESSION:  1.  Chronic pain syndrome with flare up after fall last night.  2.  Status post left knee arthroscopy December 5.  3.  Carotid back pain.   PLAN:  The patient will be admitted, IV pain medication, pain control.                                            ______________________________  J. Darreld Mclean, M.D.     JWK/MEDQ  D:  11/01/2005  T:  11/01/2005  Job:  [id number]

## 2011-04-07 NOTE — H&P (Signed)
[name], [name]                ACCOUNT NO.:  [id number]   MEDICAL RECORD NO.:  [id number]          PATIENT TYPE:  AMB   LOCATION:  DAY                           FACILITY:  [facility]   PHYSICIAN:  J. Darreld Mclean, M.D. DATE OF BIRTH:  [birth year]   DATE OF ADMISSION:  10/24/2005  DATE OF DISCHARGE:  LH                                HISTORY & PHYSICAL   CHIEF COMPLAINT:  Left knee pain.   HISTORY OF PRESENT ILLNESS:  The patient is a 50 year old male with pain and  tenderness in his knees left greater than right.  He has had an MRI of the  knees.  The left knee MRI was done on September 26, 2005 after a period of  pain and tenderness in the knee and giving way.  MRI shows a tear of the  posterior horn of the medial meniscus; anterior cruciate ligament, lateral  complex, lateral ligaments intact; lateral meniscus intact.  Patient also  has a tear of the medial meniscus of the other knee; this was demonstrated  by an MRI approximately a month earlier.  Patient has a long, long history  of pain and chronic pain.  He has been followed at [facility] at the pain clinic for a period of time, he has had epidurals,  intercostal injections and was just recently discharged from the [facility] on September 25, 2005.  Patient still has pain but it is  improved from what it was before.  He is complaining of more giving away of  the knees and pain.  He is complaining of a fullness mainly located to the  lower back.   PAST HISTORY:  Positive for hypertension, diabetes oral controlled.   ALLERGIES:  He has no allergies.   MEDICATIONS:  He is currently taking Zocor 20 mg a day, __________ one a  day, __________  one a day.  He is also taking for pain Percocet 7.5/325.  He is on diazepam 5 mg every 6 hours for back pain and spasms.   HABITS:  Smokes a pack to a pack and a half a day of cigarettes.  Does not  use alcoholic beverages socially.   SURGICAL  HISTORY:  Previous surgery is hand surgery in 1983, hernia repair  in 1994, vein stripping of the right leg in 2001.   FAMILY HISTORY:  Denies any diseases that run in the family.   SOCIAL HISTORY:  Works for [employer].  He is married and lives in [place].   PHYSICAL EXAMINATION:  BP is 140/80, pulse 76, respirations 16, afebrile,  height 5 feet 9 inches, weight 210.  Patient alert, cooperative, oriented.  HEENT negative.  Neck supple.  Lungs clear to P&A.  Heart regular without  murmur heard.  Abdomen soft, nontender, without masses.  Got pain and  tenderness in both knees, slight effusion both knees, left hurts him more  than right, got a positive McMurray's both knees medially, got crepitus to  both knees more pain and tenderness in the left knee, range of motion of  left knee  is 5 degrees to 95 degrees, range of motion of the right knee 0  degrees to 115 degrees but knees are stable.  Got chronic low back pain with  diffuse tenderness but no spasms present.  Reflexes are bilaterally equal.  CNS intact.  Straight leg raising is normal.  Gait has a slight limp to the  left.  Skin is intact.   IMPRESSION:  1.  Tear of medial meniscus of the left knee, tear of medial meniscus of the      right knee, chronic low back pain.  2.  Chronic pain syndrome.  3.  History of hypertension.  4.  History of diabetes orally controlled.   PLAN:  Arthroscopy of the left knee.  Discussed with patient the planned  procedure risks and imponderables.  I went over this with his wife.  They  both understood.  Patient understands and agrees to the procedure as  outlined.  Labs are pending.                                            ______________________________  J. Darreld Mclean, M.D.     JWK/MEDQ  D:  10/23/2005  T:  10/23/2005  Job:  [id number]

## 2011-04-07 NOTE — Discharge Summary (Signed)
NAMESHELLEY, [name] NO.:  [id number]   MEDICAL RECORD NO.:  [id number]          PATIENT TYPE:  EMS   LOCATION:  ED                            FACILITY:  [facility]   PHYSICIAN:  J. Darreld Mclean, M.D. DATE OF BIRTH:  18-Jul-1961   DATE OF ADMISSION:  03/29/2006  DATE OF DISCHARGE:  05/10/2007LH                                 DISCHARGE SUMMARY   DISCHARGE DIAGNOSES:  1.  Acute low back pain.  2.  Chronic back pain.  3.  Chronic pain syndrome.  4.  History of recent deep vein thrombosis right leg on Coumadin.  5.  Status post bilateral knee arthroscopies.   DISCHARGE STATUS:  Good.   DISPOSITION:  Home.   DISCHARGE MEDICATIONS:  Lortab 10 mg 1 every 6 hours p.r.n. pain.  He is to  cut back on his pain medicine and take it only once every six hours.  Follow  up with the [facility] for Coumadin doses.   To be seen in my office on Wednesday, March 07, 2006 at 8:30.  Continue  other medications.   BRIEF HISTORY:  The patient has an acute low back pain.  It is not getting  any better.  It is getting severely worse.  He has a history of chronic pain  syndrome.  We will put him in the hospital because the acute pain was not  controlled by any means as an outpatient.  He was placed on IV morphine,  given bed rest and was seen by physical therapy.  He remained afebrile.  His  pro time was markedly elevated.  His INR was 4.7 and we held his Coumadin.  When he was originally, his vasospasm decreased and he was doing better the  day after admission.  There was a concern about him having slurred speech  and having too much medicine.  We cut back on his morphine.   On the 13th, he was walking better.  He was doing better.  He was having  less pain and less tenderness.  INR is 3.9 on the 13th but he was supposed  to be seen by the [facility] later that day.  He was having less pain  and less tenderness, improved with his therapy and I recommended he go home  but to  cut back on his pain medicine.  Instructions were given in detail.  See me in the office as stated.  Any difficulties, contact me through the  office or the hospital, be persistent.  Labs otherwise negative.                                            ______________________________  Shela Commons. Darreld Mclean, M.D.     JWK/MEDQ  D:  04/24/2006  T:  04/24/2006  Job:  [id number]

## 2011-04-07 NOTE — Op Note (Signed)
NAMETHADIUS, SMISEK                ACCOUNT NO.:  [id number]   MEDICAL RECORD NO.:  [id number]          PATIENT TYPE:  AMB   LOCATION:  DAY                           FACILITY:  [facility]   PHYSICIAN:  J. Darreld Mclean, M.D. DATE OF BIRTH:  10-22-1961   DATE OF PROCEDURE:  10/24/2005  DATE OF DISCHARGE:                                 OPERATIVE REPORT   PREOPERATIVE DIAGNOSIS:  Tear of the medial meniscus of the left knee.   POSTOPERATIVE DIAGNOSIS:  Tear of the medial meniscus of the left knee.   PROCEDURE:  Operative arthroscopy, partial medial meniscectomy of the left  knee.   SURGEON:  Dr. Hilda Lias.   ANESTHESIA:  General.   SPECIMENS:  Meniscus sent to pathology.   TOURNIQUET TIME:  20 minutes.   DRAINS:  No drains.   INDICATIONS:  The patient is a 50 year old male with pain and tenderness in  his left knee. MRI shows tear of the posterior horn of the medial meniscus.  The patient has continued pain, giving way, effusion, and would like to have  surgery. Risks and imponderables were discussed perioperatively. The patient  appeared to understand and agreed to the procedure as outlined.   DESCRIPTION OF PROCEDURE:  The patient was seen in the holding area. The  left knee was identified as the correct surgical site. He placed a mark on  the left knee. I placed a mark on the left knee. He was brought back to the  operating room. He was placed supine on the operating room table and given  general anesthesia. Leg holder and tourniquet placed deflated in the left  thigh area. A roll pack was placed under the back because he had a history  of low back pain. Prepped and draped in the usual manner. Had a time out  identifying the patient and doing the left knee. Leg was wrapped  circumferentially with an Esmarch bandage. Tourniquet inflated to 350 mmHg.  Tourniquet inflated, Esmarch bandage removed. Inflow cannula inserted  medially, lactated ringers instilled in the knee by an  infusion pump.  Arthroscope inserted laterally and the knee systematically examined.   FINDINGS:  Patellar pouch basically was negative, undersurface of the  patella with some mild grade 2 changes, medially, there was a tear of the  posterior horn and medial meniscus and cartilage articular looked good with  grade 2 changes, anterior cruciate was intact, laterally, it was normal, no  loose bodies.   Attention was directed to the medial side and then using a laser, removed  the torn portion. Used a probe first and then a laser. A good smooth contour  was obtained. Permanent pictures were taken. Knee was systematically  reexamined, and no new pathology found. Wounds were irrigated with the  remainder part of lactated ringers. Wounds were reapproximated using 3-0  nylon interrupted vertical mattress manner. Marcaine 0.25% instilled in each  portal. Tourniquet was deflated after 20 minutes. Sterile dressing applied.  Bulky dressing applied. The patient tolerated the procedure well. The  patient was given a prescription for Percocet for pain. I will see him  in  the office in approximately 10 days to 2 weeks. Physical therapy has been  arranged. If any difficulty, call the office or hospital beeper system;  numbers have been provided.           ______________________________  Shela Commons. Darreld Mclean, M.D.     JWK/MEDQ  D:  10/24/2005  T:  10/24/2005  Job:  [id number]

## 2011-04-07 NOTE — Op Note (Signed)
[name], [name]                ACCOUNT NO.:  [id number]   MEDICAL RECORD NO.:  [id number]          PATIENT TYPE:  AMB   LOCATION:  DAY                           FACILITY:  [facility]   PHYSICIAN:  J. Darreld Mclean, M.D. DATE OF BIRTH:  [birth year]   DATE OF PROCEDURE:  11/16/2005  DATE OF DISCHARGE:                                 OPERATIVE REPORT   PREOPERATIVE DIAGNOSIS:  Tear of medial meniscus, right knee.   POSTOPERATIVE DIAGNOSIS:  Tear of medial meniscus, right knee.   PROCEDURE:  Arthroscopy of the right knee, partial medial meniscectomy.   ANESTHESIA:  General.   SURGEON:  Dr. Hilda Lias.   No drains. Tourniquet time 22 minutes.   INDICATIONS:  The patient is a 50 year old male who has had pain and  tenderness in both knees. He underwent arthroscopy of the left knee earlier  this month. MRI showed both knees have shown tears of the medial menisci. He  has done well with the left knee although he has had some chronic pain. He  has underlying chronic pain syndrome and he has chronic low back pain. Right  knee is buckling and giving way, and we plan to do arthroscopy on the right  knee. He understands the risks and imponderables, having undergone the  procedure on the left knee just a few weeks ago.   DESCRIPTION OF PROCEDURE:  He was seen on the holding area. The right knee  was identified as the correct surgical site. I placed a mark on the right  knee, and he placed a mark on the right knee. He was brought to the  operating room and given general anesthesia while supine. Leg holder and  tourniquet were placed deflated right upper thigh, prepped and draped in the  usual manner. Right knee was identified as correct surgical site, the  patient identified. This a time out. Leg was elevated circumferentially  Esmarch bandage, tourniquet inflated to 300 mmHg, Esmarch bandage removed.  Inflow cannula inserted medially. Lactated ringers into the knee by an  infusion pump.  Arthroscope was inserted laterally. The knee was  systematically examined.   FINDINGS:  Suprapatellar pouch was negative. Medially, there was a tear of  the posterior horn and medial meniscus. He had some grade 2 changes  medially. No loose bodies. Anterior cruciate intact, laterally looked good.  No changes in the gutters.   Attention directed to the medial site, and using the meniscal punch and the  laser, a good smooth contour was obtained. Meniscal shaver was used once to  remove some excess particles. Knee was systematically reexamined. No new  pathology found. Wound irrigated with remaining part of lactated ringers.  Wound was reapproximated using 3-0 nylon interrupted vertical mattress  manner. Tourniquet deflated after 22 minutes. Marcaine 0.25% instilled into  each portal. Sterile dressing applied. Bulky dressing applied. Knee  immobilizer applied. The patient tolerated the  procedure well and went to recovery in good condition. The patient given  Percocet for pain. Close instructions on use of the Percocet had been  discussed with both the patient and his wife. I  will see him in the office  in approximately 10 days to 2 weeks. He is scheduled for physical therapy.           ______________________________  J. Darreld Mclean, M.D.     JWK/MEDQ  D:  11/16/2005  T:  11/16/2005  Job:  [id number]

## 2011-04-27 NOTE — Discharge Summary (Signed)
[name], [name]                ACCOUNT NO.:  [id number]  MEDICAL RECORD NO.:  [id number]           PATIENT TYPE:  LOCATION:                                 FACILITY:  PHYSICIAN:  Amesha Bailey L. Lendell Caprice, MDDATE OF BIRTH:  [birth year]  DATE OF ADMISSION:  04/06/2011 DATE OF DISCHARGE:  05/17/2012LH                              DISCHARGE SUMMARY   DISCHARGE DIAGNOSES: 1. 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 2. Somnolence after medications. 3. Type 2 diabetes. 4. Bipolar disorder. 5. Attention deficit disorder. 6. Chronic pain. 7. Tobacco abuse, counseled against. 8. Right leg swelling, no evidence of deep vein thrombosis.  DISCHARGE MEDICATIONS:  Trazodone 50 mg 1-3 tablets nightly as needed for sleep, which is a new medication.  He may continue Dexedrine IR 10 mg a day, Dexedrine XR 15 mg twice a day, Goody powders twice daily as needed, insulin NovoLog subcutaneously sliding scale with meals, Lantus 22 units nightly, metformin 1000 mg twice a day, OxyContin 20 mg t.i.d., Zoloft 100 mg nightly.  CONDITION:  Stable.  ACTIVITY:  Ad lib.  FOLLOWUP:  With his psychiatrist.  CONSULTATIONS:  None.  PROCEDURES:  None.  LABORATORY DATA:  CBC on admission significant for hemoglobin of 11.9, hematocrit 35.9.  Basic metabolic panel significant for potassium of 3.4, glucose of 182.  Acetaminophen level less than 15.  Salicylate level 10, which is within normal limits.  Urine drug screen positive for amphetamines.  Blood alcohol negligible.  Urinalysis significant for 500 glucose, trace blood, otherwise essentially negative.  DIAGNOSTICS:  Doppler of the legs showed no DVT, short segment of superficial thrombophlebitis involving venous varicosity at the left calf.  HISTORY AND HOSPITAL COURSE:  Please see H and P for details.  [name] is a 50 year old white male on several psychotropic medications, who presented with psychosis.  He had not slept for over 3 days  and became confused.  His wife brought him to the emergency room.  He had called his psychiatrist, who prescribed Ativan, but it did not help.  I wonder whether he may have also had a paradoxical agitation with it and I have told him not to take it any longer.  He apparently was quite tremulous, agitated, and received several intramuscular injections in the emergency room.  He received IM Ativan, Dilaudid, Zofran, Geodon, and apparently was still agitated initially when the hospitalist evaluated him.  He became quite somnolent and required supplemental oxygen after the medications eventually took effect.  He was monitored in the step-down unit.  He had some leg swelling and Doppler ruled out DVT, although, he did have superficial thrombophlebitis.  He is now awake.  He notes he is at [facility] and he is completely appropriate. He reports that this has happened to him in the past when he has had difficult time sleeping.  He denies taking his medications other than as directed.  He reports that he had been on trazodone in the past.  He is cooperative and medically stable for discharge.  I have given a prescription for a few trazodone and I have instructed him to follow up with his  psychiatrist first available appointment.     Bonham Zingale L. Lendell Caprice, MD     CLS/MEDQ  D:  04/06/2011  T:  04/07/2011  Job:  [id number]  Electronically Signed by Crista Curb MD on 04/27/2011 07:25:39 AM

## 2011-08-29 ENCOUNTER — Emergency Department (HOSPITAL_COMMUNITY)
Admission: EM | Admit: 2011-08-29 | Discharge: 2011-08-29 | Disposition: A | Discharge status: Home / Self Care | Payer: BC Managed Care – PPO | Attending: Emergency Medicine | Admitting: Emergency Medicine

## 2011-08-29 ENCOUNTER — Encounter: Payer: Self-pay | Admitting: *Deleted

## 2011-08-29 DIAGNOSIS — M2550 Pain in unspecified joint: Secondary | ICD-10-CM | POA: Insufficient documentation

## 2011-08-29 DIAGNOSIS — Z79899 Other long term (current) drug therapy: Secondary | ICD-10-CM | POA: Insufficient documentation

## 2011-08-29 DIAGNOSIS — L03317 Cellulitis of buttock: Secondary | ICD-10-CM | POA: Insufficient documentation

## 2011-08-29 DIAGNOSIS — R000 Tachycardia, unspecified: Secondary | ICD-10-CM | POA: Insufficient documentation

## 2011-08-29 DIAGNOSIS — M549 Dorsalgia, unspecified: Secondary | ICD-10-CM | POA: Insufficient documentation

## 2011-08-29 DIAGNOSIS — L0231 Cutaneous abscess of buttock: Secondary | ICD-10-CM | POA: Insufficient documentation

## 2011-08-29 DIAGNOSIS — M2540 Effusion, unspecified joint: Secondary | ICD-10-CM | POA: Insufficient documentation

## 2011-08-29 DIAGNOSIS — R630 Anorexia: Secondary | ICD-10-CM | POA: Insufficient documentation

## 2011-08-29 HISTORY — DX: Major depressive disorder, single episode, unspecified: F32.9

## 2011-08-29 HISTORY — DX: Depression, unspecified: F32.A

## 2011-08-29 MED ORDER — OXYCODONE-ACETAMINOPHEN 5-325 MG PO TABS
2.0000 | ORAL_TABLET | Freq: Once | ORAL | Status: DC
  Filled 2011-08-29: qty 2

## 2011-08-29 MED ORDER — ONDANSETRON HCL 4 MG PO TABS
4.0000 mg | ORAL_TABLET | Freq: Once | ORAL | Status: AC
  Administered 2011-08-29: 4 mg via ORAL
  Filled 2011-08-29: qty 1

## 2011-08-29 MED ORDER — DOXYCYCLINE HYCLATE 100 MG PO TABS
100.0000 mg | ORAL_TABLET | Freq: Once | ORAL | Status: AC
  Administered 2011-08-29: 100 mg via ORAL
  Filled 2011-08-29: qty 1

## 2011-08-29 MED ORDER — IBUPROFEN 800 MG PO TABS
800.0000 mg | ORAL_TABLET | Freq: Once | ORAL | Status: AC
  Administered 2011-08-29: 800 mg via ORAL
  Filled 2011-08-29: qty 1

## 2011-08-29 MED ORDER — DOXYCYCLINE HYCLATE 100 MG PO CAPS: 100.0000 mg | ORAL_CAPSULE | Freq: Two times a day (BID) | ORAL | Status: DC

## 2011-08-29 MED ORDER — LIDOCAINE-EPINEPHRINE (PF) 1 %-1:200000 IJ SOLN
INTRAMUSCULAR | Status: AC
  Administered 2011-08-29: 10 mL
  Filled 2011-08-29: qty 10

## 2011-08-29 MED ORDER — LIDOCAINE HCL (PF) 1 % IJ SOLN
INTRAMUSCULAR | Status: AC
  Administered 2011-08-29: 2.1 mL
  Filled 2011-08-29: qty 5

## 2011-08-29 MED ORDER — AMOXICILLIN-POT CLAVULANATE 875-125 MG PO TABS: 1.0000 | ORAL_TABLET | Freq: Two times a day (BID) | ORAL | Status: AC

## 2011-08-29 MED ORDER — CEFTRIAXONE SODIUM 1 G IJ SOLR
1.0000 g | Freq: Once | INTRAMUSCULAR | Status: AC
  Administered 2011-08-29: 1 g via INTRAMUSCULAR
  Filled 2011-08-29: qty 1

## 2011-08-29 NOTE — ED Provider Notes (Signed)
Medical screening examination/treatment/procedure(s) were performed by non-physician practitioner and as supervising physician I was immediately available for consultation/collaboration.   Gerhard Munch, MD 08/29/11 2159

## 2011-08-29 NOTE — ED Provider Notes (Signed)
History     CSN: [id number] Arrival date & time: 08/29/2011  5:38 PM  Chief Complaint  Patient presents with  . Abscess    (Consider location/radiation/quality/duration/timing/severity/associated sxs/prior treatment) Patient is a 50 y.o. male presenting with abscess. The history is provided by the patient.  Abscess  This is a new problem. The current episode started less than one week ago. The onset was gradual. The problem has been gradually worsening. The abscess is present on the left buttock. The problem is severe. The abscess is characterized by redness and painfulness. Associated symptoms include anorexia, decreased physical activity and decreased sleep. Pertinent negatives include no fever, no vomiting and no cough. There were no sick contacts.    Past Medical History  Diagnosis Date  . Diabetes mellitus   . Depression     Past Surgical History  Procedure Date  . Hernia repair   . Knee surgery   . Finger amputation     History reviewed. No pertinent family history.  History  Substance Use Topics  . Smoking status: Current Everyday Smoker -- 1.0 packs/day  . Smokeless tobacco: Not on file  . Alcohol Use: No      Review of Systems  Constitutional: Negative for fever and activity change.       All ROS Neg except as noted in HPI  HENT: Negative for nosebleeds and neck pain.   Eyes: Negative for photophobia and discharge.  Respiratory: Negative for cough, shortness of breath and wheezing.   Cardiovascular: Negative for chest pain and palpitations.  Gastrointestinal: Positive for anorexia. Negative for vomiting, abdominal pain and blood in stool.  Genitourinary: Negative for dysuria, frequency and hematuria.  Musculoskeletal: Positive for back pain, joint swelling and arthralgias.  Skin: Negative.   Neurological: Negative for dizziness, seizures and speech difficulty.  Psychiatric/Behavioral: Negative for hallucinations and confusion.    Allergies  Review of  patient's allergies indicates no known allergies.  Home Medications   Current Outpatient Rx  Name Route Sig Dispense Refill  . AMOXICILLIN-POT CLAVULANATE 875-125 MG PO TABS Oral Take 1 tablet by mouth 2 (two) times daily after a meal. 10 tablet 0  . DOXYCYCLINE HYCLATE 100 MG PO CAPS Oral Take 1 capsule (100 mg total) by mouth 2 (two) times daily. 14 capsule 0    BP 141/82  Pulse 114  Temp(Src) 98.7 F (37.1 C) (Oral)  Resp 20  Ht 5\' 10"  (1.778 m)  Wt 198 lb (89.812 kg)  BMI 28.41 kg/m2  SpO2 99%  Physical Exam  Nursing note and vitals reviewed. Constitutional: He is oriented to person, place, and time. He appears well-developed and well-nourished.  Non-toxic appearance.  HENT:  Head: Normocephalic.  Right Ear: Tympanic membrane and external ear normal.  Left Ear: Tympanic membrane and external ear normal.  Eyes: EOM and lids are normal. Pupils are equal, round, and reactive to light.  Neck: Normal range of motion. Neck supple. Carotid bruit is not present.  Cardiovascular: Regular rhythm, normal heart sounds, intact distal pulses and normal pulses.  Tachycardia present.   Pulmonary/Chest: Breath sounds normal. No respiratory distress.  Abdominal: Soft. Bowel sounds are normal. There is no tenderness. There is no guarding.  Genitourinary:       Mod sized painful abscess of the left buttox. Anus not involved. Warm to touch.  Musculoskeletal: Normal range of motion.  Lymphadenopathy:       Head (right side): No submandibular adenopathy present.       Head (left side): No submandibular  adenopathy present.    He has no cervical adenopathy.  Neurological: He is alert and oriented to person, place, and time. He has normal strength. No cranial nerve deficit or sensory deficit.  Skin: Skin is warm and dry.  Psychiatric: He has a normal mood and affect. His speech is normal.    ED Course  Procedures (including critical care time)   Labs Reviewed  CULTURE, ROUTINE-ABSCESS    No results found.   1. Abscess of buttock, left       MDM  I have reviewed nursing notes, vital signs, and all appropriate lab and imaging results for this patient.        Kathie Dike, [place] 08/29/11 [phone]

## 2011-08-29 NOTE — ED Notes (Signed)
Reports abscess to buttock "the size of a baseball".

## 2011-08-29 NOTE — ED Notes (Signed)
Pt c/o pain to left side of buttock. Pt states he has had an abscess since last Thursday.

## 2011-09-01 ENCOUNTER — Encounter (HOSPITAL_COMMUNITY): Payer: Self-pay | Admitting: Oncology

## 2011-09-01 ENCOUNTER — Emergency Department (HOSPITAL_COMMUNITY)
Admission: EM | Admit: 2011-09-01 | Discharge: 2011-09-01 | Disposition: A | Discharge status: Home / Self Care | Payer: BC Managed Care – PPO | Attending: Emergency Medicine | Admitting: Emergency Medicine

## 2011-09-01 DIAGNOSIS — L0231 Cutaneous abscess of buttock: Secondary | ICD-10-CM | POA: Insufficient documentation

## 2011-09-01 DIAGNOSIS — Z794 Long term (current) use of insulin: Secondary | ICD-10-CM | POA: Insufficient documentation

## 2011-09-01 DIAGNOSIS — F329 Major depressive disorder, single episode, unspecified: Secondary | ICD-10-CM | POA: Insufficient documentation

## 2011-09-01 DIAGNOSIS — E119 Type 2 diabetes mellitus without complications: Secondary | ICD-10-CM | POA: Insufficient documentation

## 2011-09-01 DIAGNOSIS — F3289 Other specified depressive episodes: Secondary | ICD-10-CM | POA: Insufficient documentation

## 2011-09-01 DIAGNOSIS — L03317 Cellulitis of buttock: Secondary | ICD-10-CM | POA: Insufficient documentation

## 2011-09-01 LAB — CULTURE, ROUTINE-ABSCESS

## 2011-09-01 NOTE — ED Notes (Signed)
Pt states x 4 days ago he had an I&D to an abscess on his buttocks - states he is in the ER today to have packing removed.  Pt denies any complaints at present.

## 2011-09-02 NOTE — ED Provider Notes (Signed)
Medical screening examination/treatment/procedure(s) were performed by non-physician practitioner and as supervising physician I was immediately available for consultation/collaboration.  Atziri Zubiate R. Eulan Heyward, MD 09/02/11 1453 

## 2011-09-02 NOTE — ED Provider Notes (Signed)
History     CSN: [id number] Arrival date & time: 09/01/2011  5:46 PM  Chief Complaint  Patient presents with  . Packing removal     (Consider location/radiation/quality/duration/timing/severity/associated sxs/prior treatment) Patient is a 50 y.o. male presenting with wound check. The history is provided by the patient.  Wound Check  He was treated in the ED 2 to 3 days ago. Previous treatment in the ED includes I&D of abscess. Treatments since wound repair include oral antibiotics. There has been clear discharge from the wound. The redness has improved. The swelling has improved. The pain has improved.    Past Medical History  Diagnosis Date  . Diabetes mellitus   . Depression     Past Surgical History  Procedure Date  . Hernia repair   . Knee surgery   . Finger amputation   . Total knee arthroplasty     Right knee    No family history on file.  History  Substance Use Topics  . Smoking status: Current Everyday Smoker -- 1.0 packs/day  . Smokeless tobacco: Not on file  . Alcohol Use: No      Review of Systems  Constitutional: Negative for fever.  HENT: Negative for congestion, sore throat and neck pain.   Eyes: Negative.   Respiratory: Negative for chest tightness and shortness of breath.   Cardiovascular: Negative for chest pain.  Gastrointestinal: Negative for nausea and abdominal pain.  Genitourinary: Negative.   Musculoskeletal: Negative for joint swelling and arthralgias.  Skin: Positive for wound.  Neurological: Negative.  Negative for weakness.  Hematological: Negative.   Psychiatric/Behavioral: Negative.     Allergies  Review of patient's allergies indicates no known allergies.  Home Medications   Current Outpatient Rx  Name Route Sig Dispense Refill  . AMOXICILLIN-POT CLAVULANATE 875-125 MG PO TABS Oral Take 1 tablet by mouth 2 (two) times daily after a meal. 10 tablet 0  . DEXTROAMPHETAMINE SULFATE ER 15 MG PO CP24 Oral Take 15 mg by mouth 2  (two) times daily.      Marland Kitchen DOXYCYCLINE HYCLATE 100 MG PO CAPS Oral Take 100 mg by mouth 2 (two) times daily. Based on sliding scale     . INSULIN ASPART 100 UNIT/ML [place] SOLN Subcutaneous Inject 2-8 Units into the skin 4 (four) times daily - after meals and at bedtime.      . INSULIN GLARGINE 100 UNIT/ML [place] SOLN Subcutaneous Inject 28 Units into the skin at bedtime.      Marland Kitchen METFORMIN HCL 500 MG PO TABS Oral Take 1,000 mg by mouth every evening.      Marland Kitchen MORPHINE SULFATE ER 30 MG PO TB12 Oral Take 30 mg by mouth 2 (two) times daily.      Marland Kitchen NAPHAZOLINE HCL 0.012 % OP SOLN Both Eyes Place 1 drop into both eyes daily as needed. For relief     . SERTRALINE HCL 50 MG PO TABS Oral Take 50 mg by mouth every evening.        BP 146/88  Pulse 90  Temp(Src) 98.9 F (37.2 C) (Oral)  Resp 28  Ht 5\' 10"  (1.778 m)  Wt 192 lb (87.091 kg)  BMI 27.55 kg/m2  SpO2 100%  Physical Exam  Nursing note and vitals reviewed. Constitutional: He is oriented to person, place, and time. He appears well-developed and well-nourished.  HENT:  Head: Normocephalic and atraumatic.  Eyes: Conjunctivae are normal.  Neck: Normal range of motion.  Cardiovascular: Normal rate, regular rhythm, normal heart sounds  and intact distal pulses.   Pulmonary/Chest: Effort normal and breath sounds normal. He has no wheezes.  Abdominal: Soft. Bowel sounds are normal. There is no tenderness.  Musculoskeletal: Normal range of motion.  Neurological: He is alert and oriented to person, place, and time.  Skin: Skin is warm and dry.       Clean abscess site with  Healthy appearing granulation tissue within abscess.  No drainage,  No surrounding cellulitis.  Psychiatric: He has a normal mood and affect.    ED Course  Wound packing Performed by: Chrysta Fulcher L Authorized by: Burgess Amor L Consent: Verbal consent obtained. Risks and benefits: risks, benefits and alternatives were discussed Consent given by: patient Patient understanding:  patient states understanding of the procedure being performed Time out: Immediately prior to procedure a "time out" was called to verify the correct patient, procedure, equipment, support staff and site/side marked as required. Preparation: Patient was prepped and draped in the usual sterile fashion. Local anesthesia used: no Comments: 1/2 inch iodoform guaze loosely packed in abscess after old packing removed and gentle flush with saline.  Patient tolerated well.   (including critical care time)  Labs Reviewed - No data to display No results found.   1. Abscess of buttock, left       MDM  Abscess recheck.  Continue abx.  If no worsened sx, patient to remove packing in 2 days, then start warm epsom salt soaks bid.  Finish abx.        Candis Musa, PA 09/02/11 [phone]

## 2011-09-02 NOTE — ED Notes (Signed)
+   abscess culture. Chart sent to EDP office for review 

## 2011-09-18 ENCOUNTER — Emergency Department (HOSPITAL_COMMUNITY)
Admission: EM | Admit: 2011-09-18 | Discharge: 2011-09-18 | Disposition: A | Discharge status: Home / Self Care | Payer: BC Managed Care – PPO | Attending: Emergency Medicine | Admitting: Emergency Medicine

## 2011-09-18 ENCOUNTER — Encounter (HOSPITAL_COMMUNITY): Payer: Self-pay | Admitting: Emergency Medicine

## 2011-09-18 ENCOUNTER — Emergency Department (HOSPITAL_COMMUNITY): Payer: BC Managed Care – PPO

## 2011-09-18 ENCOUNTER — Other Ambulatory Visit: Payer: Self-pay

## 2011-09-18 DIAGNOSIS — J329 Chronic sinusitis, unspecified: Secondary | ICD-10-CM

## 2011-09-18 DIAGNOSIS — F3289 Other specified depressive episodes: Secondary | ICD-10-CM | POA: Insufficient documentation

## 2011-09-18 DIAGNOSIS — Z794 Long term (current) use of insulin: Secondary | ICD-10-CM | POA: Insufficient documentation

## 2011-09-18 DIAGNOSIS — E119 Type 2 diabetes mellitus without complications: Secondary | ICD-10-CM | POA: Insufficient documentation

## 2011-09-18 DIAGNOSIS — F329 Major depressive disorder, single episode, unspecified: Secondary | ICD-10-CM | POA: Insufficient documentation

## 2011-09-18 DIAGNOSIS — F17200 Nicotine dependence, unspecified, uncomplicated: Secondary | ICD-10-CM | POA: Insufficient documentation

## 2011-09-18 DIAGNOSIS — R9431 Abnormal electrocardiogram [ECG] [EKG]: Secondary | ICD-10-CM | POA: Insufficient documentation

## 2011-09-18 HISTORY — DX: Disorder of kidney and ureter, unspecified: N28.9

## 2011-09-18 LAB — DIFFERENTIAL
Basophils Relative: 1 % (ref 0–1)
Eosinophils Absolute: 0.1 10*3/uL (ref 0.0–0.7)
Lymphs Abs: 1.9 10*3/uL (ref 0.7–4.0)
Monocytes Relative: 9 % (ref 3–12)
Neutro Abs: 2.3 10*3/uL (ref 1.7–7.7)
Neutrophils Relative %: 47 % (ref 43–77)

## 2011-09-18 LAB — BASIC METABOLIC PANEL
BUN: 11 mg/dL (ref 6–23)
Chloride: 98 mEq/L (ref 96–112)
GFR calc Af Amer: >90 mL/min (ref >90)
GFR calc non Af Amer: >90 mL/min (ref >90)
Glucose, Bld: 205 mg/dL — ABNORMAL HIGH (ref 70–99)
Potassium: 3 mEq/L — ABNORMAL LOW (ref 3.5–5.1)
Sodium: 134 mEq/L — ABNORMAL LOW (ref 135–145)

## 2011-09-18 LAB — CBC
Hemoglobin: 13.1 g/dL (ref 13.0–17.0)
Platelets: 204 10*3/uL (ref 150–400)
RBC: 4.73 MIL/uL (ref 4.22–5.81)

## 2011-09-18 MED ORDER — AZITHROMYCIN 250 MG PO TABS: ORAL_TABLET | ORAL | Status: DC

## 2011-09-18 MED ORDER — HYDROCOD POLST-CHLORPHEN POLST 10-8 MG/5ML PO LQCR: ORAL | Status: DC

## 2011-09-18 MED ORDER — LORAZEPAM 2 MG/ML IJ SOLN
1.0000 mg | Freq: Once | INTRAMUSCULAR | Status: AC
  Administered 2011-09-18: 1 mg via INTRAVENOUS
  Filled 2011-09-18: qty 1

## 2011-09-18 MED ORDER — HYDROMORPHONE HCL 1 MG/ML IJ SOLN
1.0000 mg | Freq: Once | INTRAMUSCULAR | Status: AC
  Administered 2011-09-18: 1 mg via INTRAVENOUS
  Filled 2011-09-18: qty 1

## 2011-09-18 MED ORDER — SODIUM CHLORIDE 0.9 % IV BOLUS (SEPSIS)
1000.0000 mL | Freq: Once | INTRAVENOUS | Status: AC
  Administered 2011-09-18: 1000 mL via INTRAVENOUS

## 2011-09-18 NOTE — ED Notes (Signed)
Nonstick dressing applied to the pt's rt lower leg.

## 2011-09-18 NOTE — ED Provider Notes (Signed)
History     CSN: [id number] Arrival date & time: 09/18/2011  8:02 AM   First MD Initiated Contact with Patient 09/18/11 [phone]      Chief Complaint  Patient presents with  . Nasal Congestion  . Headache  . Otalgia  . Sore Throat    (Consider location/radiation/quality/duration/timing/severity/associated sxs/prior treatment) HPI Comments: Patient has multiple complaints. C/o sore throat, frontal headache, earache, nasal congestion and dry mouth for several days.  He denies vomiting, diarrhea, or known fever.  He also reports hx of panic attacks and anxiety.  States he has been having frequent stress and anxiety recently.    Patient is a 50 y.o. male presenting with pharyngitis. The history is provided by the patient and the spouse.  Sore Throat This is a new problem. The current episode started in the past 7 days. The problem occurs constantly. The problem has been gradually worsening. Associated symptoms include chills, congestion, coughing, headaches, myalgias and a sore throat. Pertinent negatives include no abdominal pain, arthralgias, change in bowel habit, chest pain, diaphoresis, fatigue, fever, joint swelling, nausea, neck pain, numbness, rash, swollen glands, urinary symptoms, vomiting or weakness. Associated symptoms comments: earache. The symptoms are aggravated by nothing. He has tried nothing for the symptoms. The treatment provided no relief.    Past Medical History  Diagnosis Date  . Diabetes mellitus   . Depression   . Renal disorder     Past Surgical History  Procedure Date  . Hernia repair   . Knee surgery   . Finger amputation   . Total knee arthroplasty     Right knee    History reviewed. No pertinent family history.  History  Substance Use Topics  . Smoking status: Current Everyday Smoker -- 1.0 packs/day  . Smokeless tobacco: Not on file  . Alcohol Use: No      Review of Systems  Constitutional: Positive for chills. Negative for fever,  diaphoresis, activity change, appetite change and fatigue.  HENT: Positive for ear pain, congestion, sore throat, rhinorrhea, postnasal drip and sinus pressure. Negative for nosebleeds, facial swelling, trouble swallowing, neck pain, neck stiffness and voice change.   Respiratory: Positive for cough. Negative for chest tightness, shortness of breath and wheezing.   Cardiovascular: Negative for chest pain and palpitations.  Gastrointestinal: Negative for nausea, vomiting, abdominal pain, diarrhea and change in bowel habit.  Genitourinary: Negative for dysuria and hematuria.  Musculoskeletal: Positive for myalgias. Negative for back pain, joint swelling and arthralgias.  Skin: Negative for rash.  Neurological: Positive for headaches. Negative for dizziness, facial asymmetry, speech difficulty, weakness and numbness.  Hematological: Negative for adenopathy. Does not bruise/bleed easily.  Psychiatric/Behavioral: Positive for agitation. Negative for confusion. The patient is nervous/anxious.   All other systems reviewed and are negative.    Allergies  Ciprofloxacin-ciprofloxacin hcl  Home Medications   Current Outpatient Rx  Name Route Sig Dispense Refill  . BUSPIRONE HCL 10 MG PO TABS Oral Take 10 mg by mouth at bedtime as needed. sleep     . DEXTROAMPHETAMINE SULFATE ER 15 MG PO CP24 Oral Take 15 mg by mouth 2 (two) times daily.      . INSULIN ASPART 100 UNIT/ML [place] SOLN Subcutaneous Inject 2-8 Units into the skin 4 (four) times daily - after meals and at bedtime. Only uses when blood sugar 212 or higher    . INSULIN GLARGINE 100 UNIT/ML [place] SOLN Subcutaneous Inject 28 Units into the skin at bedtime.      Marland Kitchen METFORMIN  HCL 500 MG PO TABS Oral Take 1,000 mg by mouth every evening.      Marland Kitchen MORPHINE SULFATE ER 30 MG PO TB12 Oral Take 30 mg by mouth 2 (two) times daily.      Marland Kitchen NAPHAZOLINE HCL 0.012 % OP SOLN Both Eyes Place 1 drop into both eyes daily as needed. For relief     . SERTRALINE HCL 50 MG  PO TABS Oral Take 50 mg by mouth every evening.        BP 159/98  Pulse 101  Resp 24  Ht 5\' 10"  (1.778 m)  Wt 200 lb (90.719 kg)  BMI 28.70 kg/m2  SpO2 99%  Physical Exam  Nursing note and vitals reviewed. Constitutional: He is oriented to person, place, and time. He appears well-developed and well-nourished.       anxious  HENT:  Head: Normocephalic and atraumatic. No trismus in the jaw.  Right Ear: Tympanic membrane normal. No swelling or tenderness. No mastoid tenderness. No hemotympanum.  Left Ear: Tympanic membrane normal. No swelling or tenderness. No mastoid tenderness. No hemotympanum.  Nose: Mucosal edema and rhinorrhea present. No sinus tenderness. Right sinus exhibits no maxillary sinus tenderness and no frontal sinus tenderness. Left sinus exhibits no maxillary sinus tenderness and no frontal sinus tenderness.  Mouth/Throat: Uvula is midline, oropharynx is clear and moist and mucous membranes are normal. No oral lesions. No dental abscesses or uvula swelling.  Eyes: EOM are normal. Pupils are equal, round, and reactive to light.  Neck: Normal range of motion. Neck supple. No JVD present.  Cardiovascular: Normal rate, regular rhythm and normal heart sounds.   Pulmonary/Chest: Effort normal and breath sounds normal. No respiratory distress. He has no wheezes. He has no rales. He exhibits no tenderness.  Abdominal: Soft. He exhibits no distension and no mass. There is no tenderness. There is no rebound and no guarding.  Musculoskeletal: Normal range of motion. He exhibits no edema and no tenderness.  Lymphadenopathy:    He has no cervical adenopathy.  Neurological: He is alert and oriented to person, place, and time. He has normal reflexes. No cranial nerve deficit. He exhibits normal muscle tone. Coordination normal.  Skin: Skin is warm. He is not diaphoretic.  Psychiatric: His behavior is normal. Thought content normal. His mood appears anxious. His affect is not angry, not  labile and not inappropriate. His speech is not slurred.    ED Course  Procedures (including critical care time)  09/18/2011 Dg Chest 2 View  09/18/2011  *RADIOLOGY REPORT*  Clinical Data: Cough.  Sinus pain.  CHEST - 2 VIEW  Comparison: 11/01/2010  Findings:  Heart size is normal.  No pleural effusion or pulmonary edema.  No airspace consolidation identified.  The visualized osseous structures are unremarkable.  IMPRESSION:  1.  No acute cardiopulmonary abnormalities.  Original Report Authenticated By: Rosealee Albee, M.D.     Results for orders placed during the hospital encounter of 09/18/11  CBC      Component Value Range   WBC 4.9  4.0 - 10.5 (K/uL)   RBC 4.73  4.22 - 5.81 (MIL/uL)   Hemoglobin 13.1  13.0 - 17.0 (g/dL)   HCT 16.1 (*) 09.6 - 52.0 (%)   MCV 81.2  78.0 - 100.0 (fL)   MCH 27.7  26.0 - 34.0 (pg)   MCHC 34.1  30.0 - 36.0 (g/dL)   RDW 04.5  40.9 - 81.1 (%)   Platelets 204  150 - 400 (K/uL)  DIFFERENTIAL  Component Value Range   Neutrophils Relative 47  43 - 77 (%)   Neutro Abs 2.3  1.7 - 7.7 (K/uL)   Lymphocytes Relative 40  12 - 46 (%)   Lymphs Abs 1.9  0.7 - 4.0 (K/uL)   Monocytes Relative 9  3 - 12 (%)   Monocytes Absolute 0.5  0.1 - 1.0 (K/uL)   Eosinophils Relative 3  0 - 5 (%)   Eosinophils Absolute 0.1  0.0 - 0.7 (K/uL)   Basophils Relative 1  0 - 1 (%)   Basophils Absolute 0.1  0.0 - 0.1 (K/uL)  BASIC METABOLIC PANEL      Component Value Range   Sodium 134 (*) 135 - 145 (mEq/L)   Potassium 3.0 (*) 3.5 - 5.1 (mEq/L)   Chloride 98  96 - 112 (mEq/L)   CO2 23  19 - 32 (mEq/L)   Glucose, Bld 205 (*) 70 - 99 (mg/dL)   BUN 11  6 - 23 (mg/dL)   Creatinine, Ser 1.61  0.50 - 1.35 (mg/dL)   Calcium 9.6  8.4 - 09.6 (mg/dL)   GFR calc non Af Amer >90  >90 (mL/min)   GFR calc Af Amer >90  >90 (mL/min)  RAPID STREP SCREEN      Component Value Range   Streptococcus, Group A Screen (Direct) NEGATIVE  NEGATIVE       MDM     Date: 09/18/2011   Rate:90  Rhythm: normal sinus rhythm  QRS Axis: normal  Intervals: QT prolonged  ST/T Wave abnormalities: normal  Conduction Disutrbances:none  Narrative Interpretation: slightly prolonged QT interval  Old EKG Reviewed: unchanged   EKG read by Dr. Karma Ganja   1100 am  Patient resting, feeling better, no appears calm.  Vitals stable.  Likely URI symptoms but patient's symptoms also appear c/w panic attack.  He is non-toxic appearing.  He and spouse agree to close f/u with his PMD or return to ED if symptoms worsen.     Lodema Parma L. [place], [place] 09/20/11 2252

## 2011-09-18 NOTE — ED Notes (Signed)
Pt states he is losing the feeling in his feet and states the infection is spreading all over his body.

## 2011-09-18 NOTE — ED Notes (Signed)
Per lab pt's strep screen is negative.

## 2011-09-18 NOTE — ED Notes (Signed)
Pt c/o increased headache, bilateral ear pain, nasal congestion since Friday pt states he has been having these symptoms since Friday.

## 2011-09-21 NOTE — ED Provider Notes (Signed)
Medical screening examination/treatment/procedure(s) were performed by non-physician practitioner and as supervising physician I was immediately available for consultation/collaboration.  Ethelda Chick, MD 09/21/11 [phone]

## 2012-06-23 ENCOUNTER — Emergency Department (HOSPITAL_COMMUNITY)
Admission: EM | Admit: 2012-06-23 | Discharge: 2012-06-23 | Disposition: A | Discharge status: Home / Self Care | Payer: BC Managed Care – PPO | Attending: Emergency Medicine | Admitting: Emergency Medicine

## 2012-06-23 ENCOUNTER — Encounter (HOSPITAL_COMMUNITY): Payer: Self-pay | Admitting: *Deleted

## 2012-06-23 DIAGNOSIS — F329 Major depressive disorder, single episode, unspecified: Secondary | ICD-10-CM | POA: Insufficient documentation

## 2012-06-23 DIAGNOSIS — E119 Type 2 diabetes mellitus without complications: Secondary | ICD-10-CM | POA: Insufficient documentation

## 2012-06-23 DIAGNOSIS — Z794 Long term (current) use of insulin: Secondary | ICD-10-CM | POA: Insufficient documentation

## 2012-06-23 DIAGNOSIS — F17200 Nicotine dependence, unspecified, uncomplicated: Secondary | ICD-10-CM | POA: Insufficient documentation

## 2012-06-23 DIAGNOSIS — R21 Rash and other nonspecific skin eruption: Secondary | ICD-10-CM | POA: Insufficient documentation

## 2012-06-23 DIAGNOSIS — F3289 Other specified depressive episodes: Secondary | ICD-10-CM | POA: Insufficient documentation

## 2012-06-23 MED ORDER — SULFAMETHOXAZOLE-TRIMETHOPRIM 800-160 MG PO TABS: 1.0000 | ORAL_TABLET | Freq: Two times a day (BID) | ORAL | Status: AC

## 2012-06-23 MED ORDER — HYDROCODONE-ACETAMINOPHEN 5-325 MG PO TABS
1.0000 | ORAL_TABLET | Freq: Once | ORAL | Status: AC
  Administered 2012-06-23: 1 via ORAL
  Filled 2012-06-23: qty 1

## 2012-06-23 MED ORDER — HYDROXYZINE HCL 25 MG PO TABS: 25.0000 mg | ORAL_TABLET | Freq: Four times a day (QID) | ORAL | Status: AC

## 2012-06-23 MED ORDER — HYDROXYZINE HCL 25 MG PO TABS
25.0000 mg | ORAL_TABLET | Freq: Once | ORAL | Status: AC
  Administered 2012-06-23: 25 mg via ORAL
  Filled 2012-06-23: qty 1

## 2012-06-23 MED ORDER — HYDROCODONE-ACETAMINOPHEN 5-325 MG PO TABS: 1.0000 | ORAL_TABLET | ORAL | Status: AC | PRN

## 2012-06-23 MED ORDER — SULFAMETHOXAZOLE-TMP DS 800-160 MG PO TABS
1.0000 | ORAL_TABLET | Freq: Once | ORAL | Status: AC
  Administered 2012-06-23: 1 via ORAL
  Filled 2012-06-23: qty 1

## 2012-06-23 MED ORDER — PREDNISONE 20 MG PO TABS
60.0000 mg | ORAL_TABLET | Freq: Once | ORAL | Status: AC
  Administered 2012-06-23: 60 mg via ORAL

## 2012-06-23 MED ORDER — PREDNISONE 10 MG PO TABS: 20.0000 mg | ORAL_TABLET | Freq: Every day | ORAL | Status: DC

## 2012-06-23 MED ORDER — PREDNISONE 10 MG PO TABS
ORAL_TABLET | ORAL | Status: AC
  Filled 2012-06-23: qty 6

## 2012-06-23 NOTE — ED Notes (Signed)
Pt reporting severe itching and rash on lower legs. Reports skin coming off. Reports symptoms for 3 days.

## 2012-06-23 NOTE — ED Notes (Addendum)
Ankles swollen more than usual and tender to touch.  Has used OTC cortisone cream with no relief.  Thinks rash is related to wearing boots at work - legs perspire, in and out of hot and cold areas

## 2012-06-24 NOTE — ED Provider Notes (Signed)
History     CSN: [id number]  Arrival date & time 06/23/12  0344   First MD Initiated Contact with Patient 06/23/12 0540      Chief Complaint  Patient presents with  . Rash    (Consider location/radiation/quality/duration/timing/severity/associated sxs/prior treatment) HPI  [name] is a 51 y.o. male who presents to the Emergency Department complaining of an itchy rash to his lower legs x 3 days. He has used no medicines. He work for [employer] and has on occasion developed a skin rash to rubber. This is similar to previous outbreaks except it itches more. He denies fever, chills, nausea, numbness, tingling, drainage.   Past Medical History  Diagnosis Date  . Diabetes mellitus   . Depression   . Renal disorder     Past Surgical History  Procedure Date  . Hernia repair   . Knee surgery   . Finger amputation   . Total knee arthroplasty     Right knee    History reviewed. No pertinent family history.  History  Substance Use Topics  . Smoking status: Current Everyday Smoker -- 1.0 packs/day  . Smokeless tobacco: Not on file  . Alcohol Use: No      Review of Systems  Constitutional: Negative for fever.       10 Systems reviewed and are negative for acute change except as noted in the HPI.  HENT: Negative for congestion.   Eyes: Negative for discharge and redness.  Respiratory: Negative for cough and shortness of breath.   Cardiovascular: Negative for chest pain.  Gastrointestinal: Negative for vomiting and abdominal pain.  Musculoskeletal: Negative for back pain.  Skin: Negative for rash.       Rash to lower legs  Neurological: Negative for syncope, numbness and headaches.  Psychiatric/Behavioral:       No behavior change.    Allergies  Ciprofloxacin-ciprofloxacin hcl  Home Medications   Current Outpatient Rx  Name Route Sig Dispense Refill  . LAMOTRIGINE 5 MG PO CHEW Oral Chew 10 mg by mouth.    . AZITHROMYCIN 250 MG PO TABS  Take two tablets on  day one, then one tab qd days 2-5 6 tablet 0  . BUSPIRONE HCL 10 MG PO TABS Oral Take 10 mg by mouth at bedtime as needed. sleep     . HYDROCOD POLST-CPM POLST ER 10-8 MG/5ML PO LQCR  Take 5 ml po q 12 hrs prn 120 mL 0  . DEXTROAMPHETAMINE SULFATE ER 15 MG PO CP24 Oral Take 15 mg by mouth 2 (two) times daily.      Marland Kitchen HYDROCODONE-ACETAMINOPHEN 5-325 MG PO TABS Oral Take 1 tablet by mouth every 4 (four) hours as needed for pain. 10 tablet 0  . HYDROXYZINE HCL 25 MG PO TABS Oral Take 1 tablet (25 mg total) by mouth every 6 (six) hours. 12 tablet 0  . INSULIN ASPART 100 UNIT/ML [place] SOLN Subcutaneous Inject 2-8 Units into the skin 4 (four) times daily - after meals and at bedtime. Only uses when blood sugar 212 or higher    . INSULIN GLARGINE 100 UNIT/ML [place] SOLN Subcutaneous Inject 28 Units into the skin at bedtime.      Marland Kitchen METFORMIN HCL 500 MG PO TABS Oral Take 1,000 mg by mouth every evening.      Marland Kitchen MORPHINE SULFATE ER 30 MG PO TB12 Oral Take 30 mg by mouth 2 (two) times daily.      Marland Kitchen NAPHAZOLINE HCL 0.012 % OP SOLN Both Eyes  Place 1 drop into both eyes daily as needed. For relief     . PREDNISONE 10 MG PO TABS Oral Take 2 tablets (20 mg total) by mouth daily. 10 tablet 0  . SERTRALINE HCL 50 MG PO TABS Oral Take 50 mg by mouth every evening.      . SULFAMETHOXAZOLE-TRIMETHOPRIM 800-160 MG PO TABS Oral Take 1 tablet by mouth 2 (two) times daily. 14 tablet 0    BP 113/54  Pulse 72  Temp 98.3 F (36.8 C) (Oral)  Resp 18  Ht 5\' 10"  (1.778 m)  Wt 208 lb (94.348 kg)  BMI 29.84 kg/m2  SpO2 98%  Physical Exam  Nursing note and vitals reviewed. Constitutional:       Awake, alert, nontoxic appearance.  HENT:  Head: Atraumatic.  Eyes: Right eye exhibits no discharge. Left eye exhibits no discharge.  Neck: Neck supple.  Cardiovascular: Normal heart sounds.   Pulmonary/Chest: Effort normal and breath sounds normal. He exhibits no tenderness.  Abdominal: Soft. There is no tenderness. There is no  rebound.  Musculoskeletal: He exhibits no tenderness.       Baseline ROM, no obvious new focal weakness.  Neurological:       Mental status and motor strength appears baseline for patient and situation.  Skin: No rash noted.       Excoriated rash to lower legs with discrete erythematous papules, scabbed lesions, no drainage.  Psychiatric: He has a normal mood and affect.    ED Course  Procedures (including critical care time)    1. Rash       MDM  Patient presents with itching rash to lower extremities that he has scratched and now has marked excoriations. Given atarax, prednisone, bactrim and hydrocodone.  Pt feels improved after observation and/or treatment in ED.Pt stable in ED with no significant deterioration in condition.The patient appears reasonably screened and/or stabilized for discharge and I doubt any other medical condition or other [facility] requiring further screening, evaluation, or treatment in the ED at this time prior to discharge.  MDM Reviewed: nursing note and vitals           Nicoletta Dress. [place], MD 06/24/12 2130

## 2012-07-08 NOTE — ED Notes (Signed)
Pt called requesting a work note from 06/23/12 visit. Unable to find any documentation that pt was to have a work note. Attempted to call pt at home # [phone]. No answer.

## 2012-11-21 ENCOUNTER — Encounter (HOSPITAL_COMMUNITY): Payer: Self-pay | Admitting: Emergency Medicine

## 2012-11-21 ENCOUNTER — Emergency Department (HOSPITAL_COMMUNITY)
Admission: EM | Admit: 2012-11-21 | Discharge: 2012-11-21 | Disposition: A | Discharge status: Home / Self Care | Payer: BC Managed Care – PPO | Attending: Emergency Medicine | Admitting: Emergency Medicine

## 2012-11-21 DIAGNOSIS — M722 Plantar fascial fibromatosis: Secondary | ICD-10-CM | POA: Insufficient documentation

## 2012-11-21 DIAGNOSIS — F17200 Nicotine dependence, unspecified, uncomplicated: Secondary | ICD-10-CM | POA: Insufficient documentation

## 2012-11-21 DIAGNOSIS — N289 Disorder of kidney and ureter, unspecified: Secondary | ICD-10-CM | POA: Insufficient documentation

## 2012-11-21 DIAGNOSIS — F329 Major depressive disorder, single episode, unspecified: Secondary | ICD-10-CM | POA: Insufficient documentation

## 2012-11-21 DIAGNOSIS — E119 Type 2 diabetes mellitus without complications: Secondary | ICD-10-CM | POA: Insufficient documentation

## 2012-11-21 DIAGNOSIS — Z79899 Other long term (current) drug therapy: Secondary | ICD-10-CM | POA: Insufficient documentation

## 2012-11-21 DIAGNOSIS — F3289 Other specified depressive episodes: Secondary | ICD-10-CM | POA: Insufficient documentation

## 2012-11-21 DIAGNOSIS — Z794 Long term (current) use of insulin: Secondary | ICD-10-CM | POA: Insufficient documentation

## 2012-11-21 LAB — GLUCOSE, CAPILLARY: Glucose-Capillary: 386 mg/dL — ABNORMAL HIGH (ref 70–99)

## 2012-11-21 MED ORDER — OXYCODONE-ACETAMINOPHEN 5-325 MG PO TABS
1.0000 | ORAL_TABLET | ORAL | Status: DC | PRN
Start: 2012-11-21 — End: 2013-08-15

## 2012-11-21 MED ORDER — HYDROMORPHONE HCL PF 1 MG/ML IJ SOLN
1.0000 mg | Freq: Once | INTRAMUSCULAR | Status: AC
  Administered 2012-11-21: 1 mg via INTRAMUSCULAR
  Filled 2012-11-21: qty 1

## 2012-11-21 MED ORDER — NAPROXEN 500 MG PO TABS: 500.0000 mg | ORAL_TABLET | Freq: Two times a day (BID) | ORAL | Status: DC | PRN

## 2012-11-21 MED ORDER — IBUPROFEN 400 MG PO TABS
600.0000 mg | ORAL_TABLET | Freq: Once | ORAL | Status: AC
  Administered 2012-11-21: 600 mg via ORAL
  Filled 2012-11-21: qty 2

## 2012-11-21 NOTE — ED Notes (Signed)
Pt c/o rt heel pain and burning x one month that has gotten worse since last Tuesday.

## 2012-11-21 NOTE — ED Notes (Signed)
Instructions, prescriptions and f/u information given/reviewed; verbalizes understanding. Left in c/o spouse for transport home.

## 2012-11-21 NOTE — ED Notes (Signed)
Pt states his sugar has been running high, but he has adjusted his lantus.

## 2012-11-21 NOTE — ED Provider Notes (Signed)
History    This chart was scribed for Raeford Razor, MD, MD by Smitty Pluck, ED Scribe. The patient was seen in room APA03 and the patient's care was started at 7:08AM.   CSN: [id number]  Arrival date & time 11/21/12  4098       Chief Complaint  Patient presents with  . Foot Pain    (Consider location/radiation/quality/duration/timing/severity/associated sxs/prior treatment) Patient is a 52 y.o. male presenting with lower extremity pain. The history is provided by the patient. No language interpreter was used.  Foot Pain This is a new problem. The current episode started more than 1 week ago. The problem occurs constantly. The problem has not changed since onset.The symptoms are aggravated by walking and standing. Nothing relieves the symptoms. He has tried nothing for the symptoms.   [name] is a 52 y.o. male who presents to the Emergency Department complaining of constant, moderate right foot pain onset 3 months ago with symptoms worsening 1 week ago. Pt reports that pain is located on heel of right foot. Pain is aggravated by walking and bearing weight. He reports hx of right knee surgeries. 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 He denies any other pain or symptoms. Pt is allergic to Cipro.   Past Medical History  Diagnosis Date  . Diabetes mellitus   . Depression   . Renal disorder     Past Surgical History  Procedure Date  . Hernia repair   . Knee surgery   . Finger amputation   . Total knee arthroplasty     Right knee    History reviewed. No pertinent family history.  History  Substance Use Topics  . Smoking status: Current Every Day Smoker -- 1.0 packs/day  . Smokeless tobacco: Not on file  . Alcohol Use: No      Review of Systems  All other systems reviewed and are negative.    Allergies  Ciprofloxacin-ciprofloxacin hcl  Home Medications   Current Outpatient Rx  Name  Route  Sig  Dispense  Refill  .  DEXTROAMPHETAMINE SULFATE ER 15 MG PO CP24   Oral   Take 15 mg by mouth 2 (two) times daily.           . INSULIN GLARGINE 100 UNIT/ML [place] SOLN   Subcutaneous   Inject 28 Units into the skin at bedtime.           Marland Kitchen LAMOTRIGINE 5 MG PO CHEW   Oral   Chew 10 mg by mouth.         . METFORMIN HCL 500 MG PO TABS   Oral   Take 1,000 mg by mouth every evening.           Marland Kitchen NAPHAZOLINE HCL 0.012 % OP SOLN   Both Eyes   Place 1 drop into both eyes daily as needed. For relief          . SERTRALINE HCL 50 MG PO TABS   Oral   Take 50 mg by mouth every evening.           . AZITHROMYCIN 250 MG PO TABS      Take two tablets on day one, then one tab qd days 2-5   6 tablet   0   . BUSPIRONE HCL 10 MG PO TABS   Oral   Take 10 mg by mouth at bedtime as needed. sleep          . HYDROCOD  POLST-CPM POLST ER 10-8 MG/5ML PO LQCR      Take 5 ml po q 12 hrs prn   120 mL   0   . INSULIN ASPART 100 UNIT/ML [place] SOLN   Subcutaneous   Inject 2-8 Units into the skin 4 (four) times daily - after meals and at bedtime. Only uses when blood sugar 212 or higher         . MORPHINE SULFATE ER 30 MG PO TB12   Oral   Take 30 mg by mouth 2 (two) times daily.           Marland Kitchen PREDNISONE 10 MG PO TABS   Oral   Take 2 tablets (20 mg total) by mouth daily.   10 tablet   0     BP 163/75  Pulse 99  Temp 97.9 F (36.6 C) (Oral)  Resp 20  Ht 5\' 11"  (1.803 m)  Wt 215 lb (97.523 kg)  BMI 29.99 kg/m2  SpO2 98%  Physical Exam  Nursing note and vitals reviewed. Constitutional: He appears well-developed and well-nourished. No distress.  HENT:  Head: Normocephalic and atraumatic.  Eyes: Conjunctivae normal are normal. Right eye exhibits no discharge. Left eye exhibits no discharge.  Neck: Neck supple.  Cardiovascular: Normal rate, regular rhythm and normal heart sounds.  Exam reveals no gallop and no friction rub.   No murmur heard. Pulmonary/Chest: Effort normal and breath sounds normal.  No respiratory distress.  Abdominal: Soft. He exhibits no distension. There is no tenderness.  Musculoskeletal:       Diffuse tenderness along plantar surface of right foot from calcaneus to ball of foot. No concerning skin changes Diffuse swelling of right foot and right lower extremity  Sensation is intact to light touch Good dp and pt pulse  Neurological: He is alert.  Skin: Skin is warm and dry.  Psychiatric: He has a normal mood and affect. His behavior is normal. Thought content normal.    ED Course  Procedures (including critical care time) DIAGNOSTIC STUDIES: Oxygen Saturation is 98% on room air, normal by my interpretation.    COORDINATION OF CARE: 7:11 AM Discussed ED treatment with pt       Labs Reviewed  GLUCOSE, CAPILLARY - Abnormal; Notable for the following:    Glucose-Capillary 386 (*)     All other components within normal limits   No results found.   1. Plantar fasciitis of right foot       MDM  52 year old male with likely plantar fasciitis. Plan course of NSAIDs. Discussed may benefit from steroid injection recommend discussing this with his orthopedist. Patient has swelling of his right lower extremity, but this is chronic in nature and likely related to multiple pole orthopedic procedures to the same leg. Doubt infectious. Doubt DVT.   I personally preformed the services scribed in my presence. The recorded information has been reviewed and considered. Raeford Razor, MD.       Raeford Razor, MD 11/22/12 [id number]

## 2013-08-15 ENCOUNTER — Emergency Department (HOSPITAL_COMMUNITY)
Admission: EM | Admit: 2013-08-15 | Discharge: 2013-08-15 | Disposition: A | Discharge status: Home / Self Care | Payer: BC Managed Care – PPO | Attending: Emergency Medicine | Admitting: Emergency Medicine

## 2013-08-15 ENCOUNTER — Encounter (HOSPITAL_COMMUNITY): Payer: Self-pay

## 2013-08-15 ENCOUNTER — Emergency Department (HOSPITAL_COMMUNITY): Payer: BC Managed Care – PPO

## 2013-08-15 DIAGNOSIS — Z96659 Presence of unspecified artificial knee joint: Secondary | ICD-10-CM | POA: Insufficient documentation

## 2013-08-15 DIAGNOSIS — W010XXA Fall on same level from slipping, tripping and stumbling without subsequent striking against object, initial encounter: Secondary | ICD-10-CM | POA: Insufficient documentation

## 2013-08-15 DIAGNOSIS — W19XXXA Unspecified fall, initial encounter: Secondary | ICD-10-CM

## 2013-08-15 DIAGNOSIS — G8929 Other chronic pain: Secondary | ICD-10-CM | POA: Insufficient documentation

## 2013-08-15 DIAGNOSIS — F3289 Other specified depressive episodes: Secondary | ICD-10-CM | POA: Insufficient documentation

## 2013-08-15 DIAGNOSIS — S335XXA Sprain of ligaments of lumbar spine, initial encounter: Secondary | ICD-10-CM | POA: Insufficient documentation

## 2013-08-15 DIAGNOSIS — Z87448 Personal history of other diseases of urinary system: Secondary | ICD-10-CM | POA: Insufficient documentation

## 2013-08-15 DIAGNOSIS — S161XXA Strain of muscle, fascia and tendon at neck level, initial encounter: Secondary | ICD-10-CM

## 2013-08-15 DIAGNOSIS — S139XXA Sprain of joints and ligaments of unspecified parts of neck, initial encounter: Secondary | ICD-10-CM | POA: Insufficient documentation

## 2013-08-15 DIAGNOSIS — Y9389 Activity, other specified: Secondary | ICD-10-CM | POA: Insufficient documentation

## 2013-08-15 DIAGNOSIS — F17200 Nicotine dependence, unspecified, uncomplicated: Secondary | ICD-10-CM | POA: Insufficient documentation

## 2013-08-15 DIAGNOSIS — E119 Type 2 diabetes mellitus without complications: Secondary | ICD-10-CM | POA: Insufficient documentation

## 2013-08-15 DIAGNOSIS — S8990XA Unspecified injury of unspecified lower leg, initial encounter: Secondary | ICD-10-CM | POA: Insufficient documentation

## 2013-08-15 DIAGNOSIS — Y9289 Other specified places as the place of occurrence of the external cause: Secondary | ICD-10-CM | POA: Insufficient documentation

## 2013-08-15 DIAGNOSIS — S39012A Strain of muscle, fascia and tendon of lower back, initial encounter: Secondary | ICD-10-CM

## 2013-08-15 DIAGNOSIS — Z79899 Other long term (current) drug therapy: Secondary | ICD-10-CM | POA: Insufficient documentation

## 2013-08-15 DIAGNOSIS — Z794 Long term (current) use of insulin: Secondary | ICD-10-CM | POA: Insufficient documentation

## 2013-08-15 DIAGNOSIS — Z9889 Other specified postprocedural states: Secondary | ICD-10-CM | POA: Insufficient documentation

## 2013-08-15 DIAGNOSIS — F329 Major depressive disorder, single episode, unspecified: Secondary | ICD-10-CM | POA: Insufficient documentation

## 2013-08-15 LAB — BASIC METABOLIC PANEL
CO2: 30 mEq/L (ref 19–32)
Calcium: 9.6 mg/dL (ref 8.4–10.5)
Creatinine, Ser: 0.73 mg/dL (ref 0.50–1.35)
GFR calc Af Amer: >90 mL/min (ref >90)
Sodium: 138 mEq/L (ref 135–145)

## 2013-08-15 MED ORDER — KETOROLAC TROMETHAMINE 60 MG/2ML IM SOLN
60.0000 mg | Freq: Once | INTRAMUSCULAR | Status: AC
  Administered 2013-08-15: 60 mg via INTRAMUSCULAR
  Filled 2013-08-15: qty 2

## 2013-08-15 MED ORDER — IBUPROFEN 600 MG PO TABS: 600.0000 mg | ORAL_TABLET | Freq: Four times a day (QID) | ORAL | Status: DC | PRN

## 2013-08-15 MED ORDER — CYCLOBENZAPRINE HCL 5 MG PO TABS: 5.0000 mg | ORAL_TABLET | Freq: Three times a day (TID) | ORAL | Status: DC | PRN

## 2013-08-15 MED ORDER — OXYCODONE-ACETAMINOPHEN 5-325 MG PO TABS: 1.0000 | ORAL_TABLET | ORAL | Status: DC | PRN

## 2013-08-15 NOTE — ED Notes (Signed)
Pt reports tripping and falling las night, now having back pain and right foot/leg pain. Reports sugar over 300 this am.  Stated his sugar is high when he is in pain.

## 2013-08-18 NOTE — ED Provider Notes (Signed)
CSN: [id number]     Arrival date & time 08/15/13  0901 History   First MD Initiated Contact with Patient 08/15/13 [phone]     Chief Complaint  Patient presents with  . Fall   (Consider location/radiation/quality/duration/timing/severity/associated sxs/prior Treatment) HPI Comments: [name] is a 52 y.o. Male presenting with neck, low back and right knee pain since last night when he tripped over go cart parts in the dark which were on his sidewalk.  He has a history of chronic pain in his lower back  Which is intermittent, constant since last night.  He describes aching pain which is constant,  Worse with movement and certain positions.  He denies weakness or numbness in his extremities.  He denies head injury and denies loc.  He has taken no medicines prior to arrival.  His blood glucose is elevated which he blames on his pain.  He has taken his lantus last night and his metformin this am.  The history is provided by the patient.    Past Medical History  Diagnosis Date  . Diabetes mellitus   . Depression   . Renal disorder    Past Surgical History  Procedure Laterality Date  . Hernia repair    . Knee surgery    . Finger amputation    . Total knee arthroplasty      Right knee   No family history on file. History  Substance Use Topics  . Smoking status: Current Every Day Smoker -- 1.00 packs/day    Types: Cigarettes  . Smokeless tobacco: Not on file  . Alcohol Use: No    Review of Systems  Constitutional: Negative for fever.  Respiratory: Negative for shortness of breath.   Cardiovascular: Negative for chest pain and leg swelling.  Gastrointestinal: Negative for abdominal pain, constipation and abdominal distention.  Genitourinary: Negative for dysuria, urgency, frequency, flank pain and difficulty urinating.  Musculoskeletal: Positive for back pain and arthralgias. Negative for joint swelling and gait problem.  Skin: Negative for rash.  Neurological: Negative for  weakness and numbness.    Allergies  Ciprofloxacin-ciprofloxacin hcl  Home Medications   Current Outpatient Rx  Name  Route  Sig  Dispense  Refill  . APIDRA SOLOSTAR 100 UNIT/ML SOPN   Subcutaneous   Inject 16 Units into the skin 3 (three) times daily before meals.          Marland Kitchen dextroamphetamine (DEXEDRINE SPANSULE) 15 MG 24 hr capsule   Oral   Take 15 mg by mouth 2 (two) times daily.           . diazepam (VALIUM) 10 MG tablet   Oral   Take 1 tablet by mouth daily as needed for sleep.          . insulin glargine (LANTUS SOLOSTAR) 100 UNIT/ML injection   Subcutaneous   Inject 60 Units into the skin at bedtime.          . lamoTRIgine (LAMICTAL) 5 MG CHEW   Oral   Chew 10 mg by mouth.         . metFORMIN (GLUCOPHAGE) 500 MG tablet   Oral   Take 1,000 mg by mouth every evening.           . naphazoline (CLEAR EYES) 0.012 % ophthalmic solution   Both Eyes   Place 1 drop into both eyes daily as needed. For relief          . sertraline (ZOLOFT) 50 MG tablet   Oral  Take 50 mg by mouth every evening.           . cyclobenzaprine (FLEXERIL) 5 MG tablet   Oral   Take 1 tablet (5 mg total) by mouth 3 (three) times daily as needed for muscle spasms.   15 tablet   0   . ibuprofen (ADVIL,MOTRIN) 600 MG tablet   Oral   Take 1 tablet (600 mg total) by mouth every 6 (six) hours as needed for pain.   30 tablet   0   . oxyCODONE-acetaminophen (PERCOCET/ROXICET) 5-325 MG per tablet   Oral   Take 1 tablet by mouth every 4 (four) hours as needed for pain.   20 tablet   0    BP 151/75  Pulse 88  Temp(Src) 98.2 F (36.8 C) (Oral)  Resp 20  Ht 5\' 10"  (1.778 m)  Wt 207 lb (93.895 kg)  BMI 29.7 kg/m2  SpO2 100% Physical Exam  Nursing note and vitals reviewed. Constitutional: He appears well-developed and well-nourished.  HENT:  Head: Normocephalic.  Eyes: Conjunctivae are normal.  Neck: Normal range of motion. Neck supple.  Cardiovascular: Normal rate  and intact distal pulses.   Pedal pulses normal.  Pulmonary/Chest: Effort normal.  Abdominal: Soft. Bowel sounds are normal. He exhibits no distension and no mass.  Musculoskeletal: Normal range of motion. He exhibits no edema.       Right knee: He exhibits no swelling, no effusion, no erythema, no LCL laxity and no MCL laxity.       Lumbar back: He exhibits tenderness. He exhibits no bony tenderness, no swelling, no edema and no spasm.  ttp right lumbar and right cervical soft tissue,  No spasm, no midline pain.  ttp right patella.  Neurological: He is alert. He has normal strength. He displays no atrophy and no tremor. No sensory deficit. Gait normal.  Reflex Scores:      Patellar reflexes are 1+ on the right side and 1+ on the left side.      Achilles reflexes are 2+ on the right side and 2+ on the left side. No strength deficit noted in hip and knee flexor and extensor muscle groups.  Ankle flexion and extension intact.  Skin: Skin is warm and dry.  Psychiatric: He has a normal mood and affect.    ED Course  Procedures (including critical care time) Labs Review Labs Reviewed  BASIC METABOLIC PANEL - Abnormal; Notable for the following:    Potassium 3.2 (*)    Glucose, Bld 240 (*)    All other components within normal limits   Imaging Review No results found.  MDM   1. Fall, initial encounter   2. Cervical strain, acute, initial encounter   3. Lumbar strain, initial encounter    Patients labs and/or radiological studies were viewed and considered during the medical decision making and disposition process. Pt given toradol injection with some improvement in pain.  He was prescribed oxycodone, ibuprofen, flexeril.  Encouraged ice tx x 2 days, add heat on day 3. Recheck by pcp if not improving over the next week.    Burgess Amor, PA-C 08/18/13 2235

## 2013-08-21 NOTE — ED Provider Notes (Signed)
Medical screening examination/treatment/procedure(s) were performed by non-physician practitioner and as supervising physician I was immediately available for consultation/collaboration.  Lowanda Cashaw, MD 08/21/13 1103 

## 2013-08-31 ENCOUNTER — Emergency Department (HOSPITAL_COMMUNITY)
Admission: EM | Admit: 2013-08-31 | Discharge: 2013-08-31 | Disposition: A | Discharge status: Home / Self Care | Payer: BC Managed Care – PPO | Attending: Emergency Medicine | Admitting: Emergency Medicine

## 2013-08-31 ENCOUNTER — Encounter (HOSPITAL_COMMUNITY): Payer: Self-pay | Admitting: Emergency Medicine

## 2013-08-31 DIAGNOSIS — Z8739 Personal history of other diseases of the musculoskeletal system and connective tissue: Secondary | ICD-10-CM | POA: Insufficient documentation

## 2013-08-31 DIAGNOSIS — F17200 Nicotine dependence, unspecified, uncomplicated: Secondary | ICD-10-CM | POA: Insufficient documentation

## 2013-08-31 DIAGNOSIS — E860 Dehydration: Secondary | ICD-10-CM

## 2013-08-31 DIAGNOSIS — R5383 Other fatigue: Secondary | ICD-10-CM

## 2013-08-31 DIAGNOSIS — R739 Hyperglycemia, unspecified: Secondary | ICD-10-CM

## 2013-08-31 DIAGNOSIS — Z794 Long term (current) use of insulin: Secondary | ICD-10-CM | POA: Insufficient documentation

## 2013-08-31 DIAGNOSIS — F329 Major depressive disorder, single episode, unspecified: Secondary | ICD-10-CM | POA: Insufficient documentation

## 2013-08-31 DIAGNOSIS — R52 Pain, unspecified: Secondary | ICD-10-CM

## 2013-08-31 DIAGNOSIS — R5381 Other malaise: Secondary | ICD-10-CM | POA: Insufficient documentation

## 2013-08-31 DIAGNOSIS — F3289 Other specified depressive episodes: Secondary | ICD-10-CM | POA: Insufficient documentation

## 2013-08-31 DIAGNOSIS — E119 Type 2 diabetes mellitus without complications: Secondary | ICD-10-CM | POA: Insufficient documentation

## 2013-08-31 DIAGNOSIS — N289 Disorder of kidney and ureter, unspecified: Secondary | ICD-10-CM | POA: Insufficient documentation

## 2013-08-31 DIAGNOSIS — Z79899 Other long term (current) drug therapy: Secondary | ICD-10-CM | POA: Insufficient documentation

## 2013-08-31 HISTORY — DX: Unspecified osteoarthritis, unspecified site: M19.90

## 2013-08-31 LAB — CBC WITH DIFFERENTIAL/PLATELET
Basophils Absolute: 0.1 10*3/uL (ref 0.0–0.1)
Eosinophils Relative: 3 % (ref 0–5)
Lymphocytes Relative: 26 % (ref 12–46)
Lymphs Abs: 2.5 10*3/uL (ref 0.7–4.0)
MCV: 84.4 fL (ref 78.0–100.0)
Neutro Abs: 6.1 10*3/uL (ref 1.7–7.7)
Neutrophils Relative %: 64 % (ref 43–77)
Platelets: 230 10*3/uL (ref 150–400)
RBC: 4.93 MIL/uL (ref 4.22–5.81)
RDW: 13.5 % (ref 11.5–15.5)
WBC: 9.5 10*3/uL (ref 4.0–10.5)

## 2013-08-31 LAB — BASIC METABOLIC PANEL
CO2: 29 mEq/L (ref 19–32)
Calcium: 10.2 mg/dL (ref 8.4–10.5)
Chloride: 94 mEq/L — ABNORMAL LOW (ref 96–112)
Glucose, Bld: 412 mg/dL — ABNORMAL HIGH (ref 70–99)
Potassium: 4.1 mEq/L (ref 3.5–5.1)
Sodium: 131 mEq/L — ABNORMAL LOW (ref 135–145)

## 2013-08-31 LAB — HEPATIC FUNCTION PANEL
ALT: 22 U/L (ref 0–53)
Albumin: 3.8 g/dL (ref 3.5–5.2)
Alkaline Phosphatase: 126 U/L — ABNORMAL HIGH (ref 39–117)
Indirect Bilirubin: 0.5 mg/dL (ref 0.3–0.9)
Total Protein: 7.3 g/dL (ref 6.0–8.3)

## 2013-08-31 LAB — GLUCOSE, CAPILLARY: Glucose-Capillary: 369 mg/dL — ABNORMAL HIGH (ref 70–99)

## 2013-08-31 MED ORDER — SODIUM CHLORIDE 0.9 % IV BOLUS (SEPSIS)
1000.0000 mL | Freq: Once | INTRAVENOUS | Status: AC
  Administered 2013-08-31: 1000 mL via INTRAVENOUS

## 2013-08-31 MED ORDER — SODIUM CHLORIDE 0.9 % IV BOLUS (SEPSIS): 1000.0000 mL | Freq: Once | INTRAVENOUS | Status: DC

## 2013-08-31 MED ORDER — OXYCODONE-ACETAMINOPHEN 5-325 MG PO TABS
2.0000 | ORAL_TABLET | Freq: Once | ORAL | Status: AC
  Administered 2013-08-31: 2 via ORAL
  Filled 2013-08-31: qty 2

## 2013-08-31 MED ORDER — INSULIN ASPART 100 UNIT/ML ~~LOC~~ SOLN
10.0000 [IU] | Freq: Once | SUBCUTANEOUS | Status: AC
  Administered 2013-08-31: 10 [IU] via SUBCUTANEOUS
  Filled 2013-08-31: qty 1

## 2013-08-31 MED ORDER — TRAMADOL HCL 50 MG PO TABS: 50.0000 mg | ORAL_TABLET | Freq: Four times a day (QID) | ORAL | Status: DC | PRN

## 2013-08-31 NOTE — ED Notes (Signed)
Medical necessity documented under wrong chart, unable to remove. Medical necessity not for this patient.

## 2013-08-31 NOTE — ED Provider Notes (Signed)
CSN: [id number]     Arrival date & time 08/31/13  1533 History   First MD Initiated Contact with Patient 08/31/13 1548     Chief Complaint  Patient presents with  . Joint Pain   (Consider location/radiation/quality/duration/timing/severity/associated sxs/prior Treatment) HPI Comments: Pt with hx of DM, depression, and arthritis - presents with several days to a week of feeling fatigued mostly in the legs and the hips and knees bialterally - worse with standing and walking, better when sitting but is having pain even when he lays down on either side of his back (whichever side is down).  He denies fevers but has some night sweats and has been told his blood counts have been elevated in the past.  No dysuria, diarrhea, coughing, LAD by report.    The history is provided by the patient.    Past Medical History  Diagnosis Date  . Diabetes mellitus   . Depression   . Renal disorder   . Arthritis    Past Surgical History  Procedure Laterality Date  . Hernia repair    . Knee surgery    . Finger amputation    . Total knee arthroplasty      Right knee   History reviewed. No pertinent family history. History  Substance Use Topics  . Smoking status: Current Every Day Smoker -- 1.00 packs/day    Types: Cigarettes  . Smokeless tobacco: Not on file  . Alcohol Use: No    Review of Systems  All other systems reviewed and are negative.    Allergies  Ciprofloxacin-ciprofloxacin hcl  Home Medications   Current Outpatient Rx  Name  Route  Sig  Dispense  Refill  . APIDRA SOLOSTAR 100 UNIT/ML SOPN   Subcutaneous   Inject 16 Units into the skin 3 (three) times daily before meals.          . cyclobenzaprine (FLEXERIL) 5 MG tablet   Oral   Take 1 tablet (5 mg total) by mouth 3 (three) times daily as needed for muscle spasms.   15 tablet   0   . dextroamphetamine (DEXEDRINE SPANSULE) 15 MG 24 hr capsule   Oral   Take 15 mg by mouth 2 (two) times daily.           . diazepam  (VALIUM) 10 MG tablet   Oral   Take 1 tablet by mouth daily as needed for sleep.          Marland Kitchen ibuprofen (ADVIL,MOTRIN) 600 MG tablet   Oral   Take 1 tablet (600 mg total) by mouth every 6 (six) hours as needed for pain.   30 tablet   0   . insulin glargine (LANTUS SOLOSTAR) 100 UNIT/ML injection   Subcutaneous   Inject 60 Units into the skin at bedtime.          . lamoTRIgine (LAMICTAL) 5 MG CHEW   Oral   Chew 10 mg by mouth daily.          . metFORMIN (GLUCOPHAGE) 500 MG tablet   Oral   Take 1,000 mg by mouth every evening.           . naphazoline (CLEAR EYES) 0.012 % ophthalmic solution   Both Eyes   Place 1 drop into both eyes daily as needed. For relief          . sertraline (ZOLOFT) 50 MG tablet   Oral   Take 50 mg by mouth every evening.           Marland Kitchen  traMADol (ULTRAM) 50 MG tablet   Oral   Take 1 tablet (50 mg total) by mouth every 6 (six) hours as needed for pain.   15 tablet   0    BP 124/71  Pulse 79  Temp(Src) 98.2 F (36.8 C) (Oral)  Resp 20  Ht 5\' 10"  (1.778 m)  Wt 207 lb (93.895 kg)  BMI 29.7 kg/m2  SpO2 96% Physical Exam  Nursing note and vitals reviewed. Constitutional: He appears well-developed and well-nourished. No distress.  HENT:  Head: Normocephalic and atraumatic.  Mouth/Throat: Oropharynx is clear and moist. No oropharyngeal exudate.  Eyes: Conjunctivae and EOM are normal. Pupils are equal, round, and reactive to light. Right eye exhibits no discharge. Left eye exhibits no discharge. No scleral icterus.  Neck: Normal range of motion. Neck supple. No JVD present. No thyromegaly present.  Cardiovascular: Normal rate, regular rhythm, normal heart sounds and intact distal pulses.  Exam reveals no gallop and no friction rub.   No murmur heard. Pulmonary/Chest: Effort normal and breath sounds normal. No respiratory distress. He has no wheezes. He has no rales.  Abdominal: Soft. Bowel sounds are normal. He exhibits no distension and no  mass. There is no tenderness.  Musculoskeletal: Normal range of motion. He exhibits no edema and no tenderness.  No tt pin the bilateral thighs, no ttp over the L spine  Lymphadenopathy:    He has no cervical adenopathy.  Neurological: He is alert. Coordination normal.  Normal strength and sensation bialterally LE's.  Skin: Skin is warm and dry. No rash noted. No erythema.  Psychiatric: He has a normal mood and affect. His behavior is normal.    ED Course  Procedures (including critical care time) Labs Review Labs Reviewed  BASIC METABOLIC PANEL - Abnormal; Notable for the following:    Sodium 131 (*)    Chloride 94 (*)    Glucose, Bld 412 (*)    BUN 26 (*)    Creatinine, Ser 1.85 (*)    GFR calc non Af Amer 40 (*)    GFR calc Af Amer 47 (*)    All other components within normal limits  GLUCOSE, CAPILLARY - Abnormal; Notable for the following:    Glucose-Capillary 369 (*)    All other components within normal limits  HEPATIC FUNCTION PANEL - Abnormal; Notable for the following:    Alkaline Phosphatase 126 (*)    All other components within normal limits  GLUCOSE, CAPILLARY - Abnormal; Notable for the following:    Glucose-Capillary 315 (*)    All other components within normal limits  CBC WITH DIFFERENTIAL  CK   Imaging Review No results found.  EKG Interpretation   None       MDM   1. Body aches   2. Fatigue   3. Hyperglycemia   4. Dehydration   5. Renal insufficiency    Pt is well appearing, labs show some hyperglycemia, possible source, he has been hyperglycemic all week - on insulin but admits to poor diet.  VS normal.  Check for rhabdo, lyte abnormalities.  Labs show dehydration likely - Cr up likely dehydration - has normal CK, CBG going down appropriately - pt appears overal stable for d/c.  D/w pt results.  F/u with PMD.  Meds given in ED:  Medications  sodium chloride 0.9 % bolus 1,000 mL (not administered)  sodium chloride 0.9 % bolus 1,000 mL (0  mLs Intravenous Stopped 08/31/13 1810)  sodium chloride 0.9 % bolus 1,000 mL (  0 mLs Intravenous Stopped 08/31/13 1916)  insulin aspart (novoLOG) injection 10 Units (10 Units Subcutaneous Given 08/31/13 1755)  oxyCODONE-acetaminophen (PERCOCET/ROXICET) 5-325 MG per tablet 2 tablet (2 tablets Oral Given 08/31/13 1809)    New Prescriptions   TRAMADOL (ULTRAM) 50 MG TABLET    Take 1 tablet (50 mg total) by mouth every 6 (six) hours as needed for pain.      Vida Roller, MD 08/31/13 [phone]

## 2013-08-31 NOTE — ED Notes (Signed)
Pt c/o pain all over.Upset due to pain and states he can go home and drink water, also c/o bed being uncomfortable. Spoke with EDP re concerns. Explained to pt why need is in need of the fluids and percocet was ordered and given for pain. Bedside chair taken to pt so comfort.

## 2013-08-31 NOTE — ED Notes (Signed)
Pt c/o all over joint pain. 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 "bad" DM. Sx's started today. Nausea. Denies v/d. Color slightly pale. Pt c/o gen weakness. Slight gen weakness noted. Alert/oreinted.

## 2014-03-15 ENCOUNTER — Emergency Department (HOSPITAL_COMMUNITY)
Admission: EM | Admit: 2014-03-15 | Discharge: 2014-03-15 | Disposition: A | Discharge status: Home / Self Care | Payer: BC Managed Care – PPO | Attending: Emergency Medicine | Admitting: Emergency Medicine

## 2014-03-15 ENCOUNTER — Encounter (HOSPITAL_COMMUNITY): Payer: Self-pay | Admitting: Emergency Medicine

## 2014-03-15 DIAGNOSIS — Z96659 Presence of unspecified artificial knee joint: Secondary | ICD-10-CM | POA: Insufficient documentation

## 2014-03-15 DIAGNOSIS — M1710 Unilateral primary osteoarthritis, unspecified knee: Secondary | ICD-10-CM | POA: Insufficient documentation

## 2014-03-15 DIAGNOSIS — Z79899 Other long term (current) drug therapy: Secondary | ICD-10-CM | POA: Insufficient documentation

## 2014-03-15 DIAGNOSIS — IMO0002 Reserved for concepts with insufficient information to code with codable children: Secondary | ICD-10-CM | POA: Insufficient documentation

## 2014-03-15 DIAGNOSIS — G8929 Other chronic pain: Secondary | ICD-10-CM | POA: Insufficient documentation

## 2014-03-15 DIAGNOSIS — M25569 Pain in unspecified knee: Secondary | ICD-10-CM

## 2014-03-15 DIAGNOSIS — Z87442 Personal history of urinary calculi: Secondary | ICD-10-CM | POA: Insufficient documentation

## 2014-03-15 DIAGNOSIS — F3289 Other specified depressive episodes: Secondary | ICD-10-CM | POA: Insufficient documentation

## 2014-03-15 DIAGNOSIS — F17200 Nicotine dependence, unspecified, uncomplicated: Secondary | ICD-10-CM | POA: Insufficient documentation

## 2014-03-15 DIAGNOSIS — Z794 Long term (current) use of insulin: Secondary | ICD-10-CM | POA: Insufficient documentation

## 2014-03-15 DIAGNOSIS — F329 Major depressive disorder, single episode, unspecified: Secondary | ICD-10-CM | POA: Insufficient documentation

## 2014-03-15 DIAGNOSIS — E119 Type 2 diabetes mellitus without complications: Secondary | ICD-10-CM | POA: Insufficient documentation

## 2014-03-15 DIAGNOSIS — Z9889 Other specified postprocedural states: Secondary | ICD-10-CM | POA: Insufficient documentation

## 2014-03-15 MED ORDER — KETOROLAC TROMETHAMINE 10 MG PO TABS
10.0000 mg | ORAL_TABLET | Freq: Once | ORAL | Status: AC
  Administered 2014-03-15: 10 mg via ORAL
  Filled 2014-03-15: qty 1

## 2014-03-15 MED ORDER — DICLOFENAC SODIUM 75 MG PO TBEC: 75.0000 mg | DELAYED_RELEASE_TABLET | Freq: Two times a day (BID) | ORAL | Status: DC

## 2014-03-15 MED ORDER — HYDROCODONE-ACETAMINOPHEN 5-325 MG PO TABS: 1.0000 | ORAL_TABLET | ORAL | Status: DC | PRN

## 2014-03-15 MED ORDER — PREDNISONE 50 MG PO TABS
60.0000 mg | ORAL_TABLET | Freq: Once | ORAL | Status: AC
  Administered 2014-03-15: 60 mg via ORAL
  Filled 2014-03-15 (×2): qty 1

## 2014-03-15 MED ORDER — PROMETHAZINE HCL 12.5 MG PO TABS
12.5000 mg | ORAL_TABLET | Freq: Once | ORAL | Status: AC
  Administered 2014-03-15: 12.5 mg via ORAL
  Filled 2014-03-15: qty 1

## 2014-03-15 MED ORDER — HYDROCODONE-ACETAMINOPHEN 5-325 MG PO TABS
2.0000 | ORAL_TABLET | Freq: Once | ORAL | Status: AC
  Administered 2014-03-15: 2 via ORAL
  Filled 2014-03-15: qty 2

## 2014-03-15 NOTE — ED Provider Notes (Signed)
Medical screening examination/treatment/procedure(s) were performed by non-physician practitioner and as supervising physician I was immediately available for consultation/collaboration.   EKG Interpretation None        Timiyah Romito L Avrom Robarts, MD 03/15/14 1940 

## 2014-03-15 NOTE — ED Provider Notes (Signed)
CSN: [id number]     Arrival date & time 03/15/14  1754 History   First MD Initiated Contact with Patient 03/15/14 1828     Chief Complaint  Patient presents with  . Knee Pain     (Consider location/radiation/quality/duration/timing/severity/associated sxs/prior Treatment) HPI Comments: Patient is a 53 year old male who presents to the urge department with knee pain, left more than right. The patient states that he has been seen by orthopedics in the past and was told that he had severe degenerative arthritis involving both knees. He had the right knee replaced, but required revision because of problems with the scene. Was told he would need a replacement on the right in 2009. He has been avoiding this because he states he had such a hard time with the left knee. Today he states that the pain has just become unbearable, and is not responding to the usual conservative measures that he has been taking. 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 He is seen by Dr. Hilda LiasKeeling, but states he could not reach Dr. Hilda LiasKeeling on Sunday.  Patient is a 53 y.o. male presenting with knee pain. The history is provided by the patient.  Knee Pain Pain details:    Duration:  2 months Associated symptoms: no back pain and no neck pain     Past Medical History  Diagnosis Date  . Diabetes mellitus   . Depression   . Arthritis   . Renal disorder     kidney stones   Past Surgical History  Procedure Laterality Date  . Hernia repair    . Knee surgery    . Finger amputation    . Total knee arthroplasty      Right knee  . Varicose vein surgery    . Meniscus repair      left and right   History reviewed. No pertinent family history. History  Substance Use Topics  . Smoking status: Current Every Day Smoker -- 1.00 packs/day    Types: Cigarettes  . Smokeless tobacco: Not on file  . Alcohol Use: No    Review of Systems  Constitutional: Negative for activity change.        All ROS Neg except as noted in HPI  HENT: Negative for nosebleeds.   Eyes: Negative for photophobia and discharge.  Respiratory: Negative for cough, shortness of breath and wheezing.   Cardiovascular: Negative for chest pain and palpitations.  Gastrointestinal: Negative for abdominal pain and blood in stool.  Genitourinary: Negative for dysuria, frequency and hematuria.  Musculoskeletal: Positive for arthralgias. Negative for back pain and neck pain.  Skin: Negative.   Neurological: Negative for dizziness, seizures and speech difficulty.  Psychiatric/Behavioral: Negative for hallucinations and confusion.       Depression      Allergies  Ciprofloxacin-ciprofloxacin hcl  Home Medications   Prior to Admission medications   Medication Sig Start Date End Date Taking? Authorizing Provider  APIDRA SOLOSTAR 100 UNIT/ML SOPN Inject 16 Units into the skin 3 (three) times daily before meals.  06/23/13   Historical Provider, MD  cyclobenzaprine (FLEXERIL) 5 MG tablet Take 1 tablet (5 mg total) by mouth 3 (three) times daily as needed for muscle spasms. 08/15/13   Burgess AmorJulie Idol, PA-C  dextroamphetamine (DEXEDRINE SPANSULE) 15 MG 24 hr capsule Take 15 mg by mouth 2 (two) times daily.      Historical Provider, MD  diazepam (VALIUM) 10 MG tablet Take 1 tablet by mouth daily  as needed for sleep.  05/12/13   Historical Provider, MD  ibuprofen (ADVIL,MOTRIN) 600 MG tablet Take 1 tablet (600 mg total) by mouth every 6 (six) hours as needed for pain. 08/15/13   Burgess Amor, PA-C  insulin glargine (LANTUS SOLOSTAR) 100 UNIT/ML injection Inject 60 Units into the skin at bedtime.     Historical Provider, MD  lamoTRIgine (LAMICTAL) 5 MG CHEW Chew 10 mg by mouth daily.     Historical Provider, MD  metFORMIN (GLUCOPHAGE) 500 MG tablet Take 1,000 mg by mouth every evening.      Historical Provider, MD  naphazoline (CLEAR EYES) 0.012 % ophthalmic solution Place 1 drop into both eyes daily as needed. For relief      Historical Provider, MD  sertraline (ZOLOFT) 50 MG tablet Take 50 mg by mouth every evening.      Historical Provider, MD  traMADol (ULTRAM) 50 MG tablet Take 1 tablet (50 mg total) by mouth every 6 (six) hours as needed for pain. 08/31/13   Vida Roller, MD   BP 132/77  Pulse 82  Temp(Src) 97.8 F (36.6 C) (Oral)  Resp 16  Ht 5\' 10"  (1.778 m)  Wt 224 lb (101.606 kg)  BMI 32.14 kg/m2  SpO2 90% Physical Exam  Nursing note and vitals reviewed. Constitutional: He is oriented to person, place, and time. He appears well-developed and well-nourished.  Non-toxic appearance.  HENT:  Head: Normocephalic.  Right Ear: Tympanic membrane and external ear normal.  Left Ear: Tympanic membrane and external ear normal.  Eyes: EOM and lids are normal. Pupils are equal, round, and reactive to light.  Neck: Normal range of motion. Neck supple. Carotid bruit is not present.  Cardiovascular: Normal rate, regular rhythm, normal heart sounds, intact distal pulses and normal pulses.   Pulmonary/Chest: Breath sounds normal. No respiratory distress.  Abdominal: Soft. Bowel sounds are normal. There is no tenderness. There is no guarding.  Musculoskeletal: Normal range of motion.  There is good range of motion of the left hip. There is significant crepitance involving the left knee. There is a mild effusion present. There is no deformity of the quadriceps area or the anterior tibial tuberosity. There is no posterior mass of the left knee. There is no deformity of the tibia or fibula. There no hot areas appreciated. The Achilles tendon is intact. The dorsalis pedis pulses 2+.  Lymphadenopathy:       Head (right side): No submandibular adenopathy present.       Head (left side): No submandibular adenopathy present.    He has no cervical adenopathy.  Neurological: He is alert and oriented to person, place, and time. He has normal strength. No cranial nerve deficit or sensory deficit.  Skin: Skin is warm and dry.   Psychiatric: He has a normal mood and affect. His speech is normal.    ED Course  Procedures (including critical care time) Labs Review Labs Reviewed - No data to display  Imaging Review No results found.   EKG Interpretation None      MDM Patient has a history of chronic arthritis involving both knees. He has had knee replacement on the right, and has been told the left needed to be replaced as well. The patient presents today because he is having pain that is not responding to his conservative measures.  I discussed with patient the need to follow up with his orthopedic specialist for assistance with this problem and for management of the pain. Prescription for Norco, diclofenac,  given to the patient. He will see the orthopedic specialist in the next one or 2 days.    Final diagnoses:  None    **I have reviewed nursing notes, vital signs, and all appropriate lab and imaging results for this patient.Kathie Dike*    Miking Usrey M Ranie Chinchilla, PA-C 03/15/14 1851

## 2014-03-15 NOTE — ED Notes (Signed)
Pt reports intermittent left knee pain x2 months. Pt reports left knee/left leg pain has intensified x1 day. Pt denies any known injury. Pt reports pain is increased with movement. No obvious deformity noted.

## 2014-03-15 NOTE — Discharge Instructions (Signed)
Your vital signs are stable. Your examination is consistent with advanced degenerative arthritis involving your left knee. Please use the knee immobilizer until you're seen by orthopedics. Please use diclofenac 2 times daily with food, may use Norco for pain if needed. Please see Dr. Hilda LiasKeeling, or your primary physician for evaluation of your knee and for pain management. Knee Pain Knee pain can be a result of an injury or other medical conditions. Treatment will depend on the cause of your pain. HOME CARE  Only take medicine as told by your doctor.  Keep a healthy weight. Being overweight can make the knee hurt more.  Stretch before exercising or playing sports.  If there is constant knee pain, change the way you exercise. Ask your doctor for advice.  Make sure shoes fit well. Choose the right shoe for the sport or activity.  Protect your knees. Wear kneepads if needed.  Rest when you are tired. GET HELP RIGHT AWAY IF:   Your knee pain does not stop.  Your knee pain does not get better.  Your knee joint feels hot to the touch.  You have a fever. MAKE SURE YOU:   Understand these instructions.  Will watch this condition.  Will get help right away if you are not doing well or get worse. Document Released: 02/02/2009 Document Revised: 01/29/2012 Document Reviewed: 02/02/2009 [facility] Patient Information 2014 [employer], [place].

## 2014-10-15 DIAGNOSIS — Z791 Long term (current) use of non-steroidal anti-inflammatories (NSAID): Secondary | ICD-10-CM | POA: Insufficient documentation

## 2014-10-15 DIAGNOSIS — F329 Major depressive disorder, single episode, unspecified: Secondary | ICD-10-CM | POA: Diagnosis not present

## 2014-10-15 DIAGNOSIS — K047 Periapical abscess without sinus: Secondary | ICD-10-CM | POA: Insufficient documentation

## 2014-10-15 DIAGNOSIS — Z79899 Other long term (current) drug therapy: Secondary | ICD-10-CM | POA: Insufficient documentation

## 2014-10-15 DIAGNOSIS — Z720 Tobacco use: Secondary | ICD-10-CM | POA: Diagnosis not present

## 2014-10-15 DIAGNOSIS — Z794 Long term (current) use of insulin: Secondary | ICD-10-CM | POA: Diagnosis not present

## 2014-10-15 DIAGNOSIS — M1990 Unspecified osteoarthritis, unspecified site: Secondary | ICD-10-CM | POA: Insufficient documentation

## 2014-10-15 DIAGNOSIS — K088 Other specified disorders of teeth and supporting structures: Secondary | ICD-10-CM | POA: Diagnosis present

## 2014-10-15 DIAGNOSIS — Z87442 Personal history of urinary calculi: Secondary | ICD-10-CM | POA: Diagnosis not present

## 2014-10-16 ENCOUNTER — Emergency Department (HOSPITAL_COMMUNITY)
Admission: EM | Admit: 2014-10-16 | Discharge: 2014-10-16 | Disposition: A | Discharge status: Home / Self Care | Payer: BC Managed Care – PPO | Attending: Emergency Medicine | Admitting: Emergency Medicine

## 2014-10-16 ENCOUNTER — Encounter (HOSPITAL_COMMUNITY): Payer: Self-pay | Admitting: *Deleted

## 2014-10-16 DIAGNOSIS — K047 Periapical abscess without sinus: Secondary | ICD-10-CM

## 2014-10-16 MED ORDER — NAPROXEN 500 MG PO TABS: 500.0000 mg | ORAL_TABLET | Freq: Two times a day (BID) | ORAL | Status: DC

## 2014-10-16 MED ORDER — PENICILLIN V POTASSIUM 250 MG PO TABS
500.0000 mg | ORAL_TABLET | Freq: Once | ORAL | Status: AC
  Administered 2014-10-16: 500 mg via ORAL
  Filled 2014-10-16: qty 2

## 2014-10-16 MED ORDER — PENICILLIN V POTASSIUM 500 MG PO TABS: 500.0000 mg | ORAL_TABLET | Freq: Four times a day (QID) | ORAL | Status: DC

## 2014-10-16 MED ORDER — HYDROCODONE-ACETAMINOPHEN 5-325 MG PO TABS: 2.0000 | ORAL_TABLET | ORAL | Status: DC | PRN

## 2014-10-16 MED ORDER — HYDROCODONE-ACETAMINOPHEN 5-325 MG PO TABS
2.0000 | ORAL_TABLET | Freq: Once | ORAL | Status: AC
  Administered 2014-10-16: 2 via ORAL
  Filled 2014-10-16: qty 2

## 2014-10-16 NOTE — ED Notes (Signed)
Pt c/o pain to left bottom tooth x 4 weeks; pt states swelling to the tooth x 1 day

## 2014-10-16 NOTE — ED Notes (Signed)
Discharge instructions given, pt demonstrated teach back and verbal understanding. No concerns voiced.  

## 2014-10-16 NOTE — Discharge Instructions (Signed)

## 2014-10-16 NOTE — ED Provider Notes (Signed)
CSN: [id number]     Arrival date & time 10/15/14  2334 History  This chart was scribed for Vida RollerBrian D Leeann Bady, MD by Gwenyth Oberatherine Macek, ED Scribe. This patient was seen in room APA05/APA05 and the patient's care was started at 12:30 AM.    Chief Complaint  Patient presents with  . Dental Pain   The history is provided by the patient. No language interpreter was used.    HPI Comments: [name] is a 53 y.o. male who presents to the Emergency Department complaining of swelling to left lower jaw that started 1 day ago and a gradually worsening abscess to his left bottom tooth that started 3 weeks ago. Pt has not been evaluated by a dentist. He denies any other medical problems.  Past Medical History  Diagnosis Date  . Diabetes mellitus   . Depression   . Arthritis   . Renal disorder     kidney stones   Past Surgical History  Procedure Laterality Date  . Hernia repair    . Knee surgery    . Finger amputation    . Total knee arthroplasty      Right knee  . Varicose vein surgery    . Meniscus repair      left and right   History reviewed. No pertinent family history. History  Substance Use Topics  . Smoking status: Current Every Day Smoker -- 1.00 packs/day    Types: Cigarettes  . Smokeless tobacco: Not on file  . Alcohol Use: No    Review of Systems  Constitutional: Negative for fever.  HENT: Positive for dental problem. Negative for voice change.   All other systems reviewed and are negative.   Allergies  Ciprofloxacin-ciprofloxacin hcl  Home Medications   Prior to Admission medications   Medication Sig Start Date End Date Taking? Authorizing Provider  APIDRA SOLOSTAR 100 UNIT/ML SOPN Inject 16 Units into the skin 3 (three) times daily before meals.  06/23/13   Historical Provider, MD  cyclobenzaprine (FLEXERIL) 5 MG tablet Take 1 tablet (5 mg total) by mouth 3 (three) times daily as needed for muscle spasms. 08/15/13   Burgess AmorJulie Idol, PA-C  dextroamphetamine (DEXEDRINE  SPANSULE) 15 MG 24 hr capsule Take 15 mg by mouth 2 (two) times daily.      Historical Provider, MD  diazepam (VALIUM) 10 MG tablet Take 1 tablet by mouth daily as needed for sleep.  05/12/13   Historical Provider, MD  diclofenac (VOLTAREN) 75 MG EC tablet Take 1 tablet (75 mg total) by mouth 2 (two) times daily. 03/15/14   Kathie DikeHobson M Bryant, PA-C  HYDROcodone-acetaminophen (NORCO/VICODIN) 5-325 MG per tablet Take 2 tablets by mouth every 4 (four) hours as needed. 10/16/14   Vida RollerBrian D Fergus Throne, MD  ibuprofen (ADVIL,MOTRIN) 600 MG tablet Take 1 tablet (600 mg total) by mouth every 6 (six) hours as needed for pain. 08/15/13   Burgess AmorJulie Idol, PA-C  insulin glargine (LANTUS SOLOSTAR) 100 UNIT/ML injection Inject 60 Units into the skin at bedtime.     Historical Provider, MD  lamoTRIgine (LAMICTAL) 5 MG CHEW Chew 10 mg by mouth daily.     Historical Provider, MD  metFORMIN (GLUCOPHAGE) 500 MG tablet Take 1,000 mg by mouth every evening.      Historical Provider, MD  naphazoline (CLEAR EYES) 0.012 % ophthalmic solution Place 1 drop into both eyes daily as needed. For relief     Historical Provider, MD  naproxen (NAPROSYN) 500 MG tablet Take 1 tablet (500 mg  total) by mouth 2 (two) times daily with a meal. 10/16/14   Vida RollerBrian D Lameisha Schuenemann, MD  penicillin v potassium (VEETID) 500 MG tablet Take 1 tablet (500 mg total) by mouth 4 (four) times daily. 10/16/14   Vida RollerBrian D Consuella Scurlock, MD  sertraline (ZOLOFT) 50 MG tablet Take 50 mg by mouth every evening.      Historical Provider, MD  traMADol (ULTRAM) 50 MG tablet Take 1 tablet (50 mg total) by mouth every 6 (six) hours as needed for pain. 08/31/13   Vida RollerBrian D Maxon Kresse, MD   BP 140/83 mmHg  Pulse 97  Temp(Src) 99 F (37.2 C) (Oral)  Resp 20  Ht 5\' 10"  (1.778 m)  Wt 201 lb (91.173 kg)  BMI 28.84 kg/m2  SpO2 99% Physical Exam  Constitutional: He appears well-developed and well-nourished.  HENT:  Head: Normocephalic and atraumatic.  Dentition in good repair. Gold filling on lower  left rear molar intact. Gingival line swollen and tender, but not fluctuant. Mild asymmetry of the jaw and left lower jaw swelling. No tenderness under the tongue. Normal voice.  Eyes: Conjunctivae are normal. Right eye exhibits no discharge. Left eye exhibits no discharge.  Neck: Neck supple.  No lymphadenopathy  Pulmonary/Chest: Effort normal. No respiratory distress.  Neurological: He is alert. Coordination normal.  Skin: Skin is warm and dry. No rash noted. He is not diaphoretic. No erythema.  Psychiatric: He has a normal mood and affect.  Nursing note and vitals reviewed.   ED Course  Procedures (including critical care time) DIAGNOSTIC STUDIES: Oxygen Saturation is 99% on RA, normal by my interpretation.    COORDINATION OF CARE: 12:35 AM Discussed treatment plan which includes antibiotics, Aleve and Vicodin. Pt agreed to plan.  Labs Review Labs Reviewed - No data to display  Imaging Review No results found.    MDM   Final diagnoses:  Dental abscess   Dental abscess / phlegmon - VS normal  - no trisumus / no signs of Ludwigs.  Meds given in ED:  Medications  penicillin v potassium (VEETID) tablet 500 mg (not administered)  HYDROcodone-acetaminophen (NORCO/VICODIN) 5-325 MG per tablet 2 tablet (not administered)    New Prescriptions   HYDROCODONE-ACETAMINOPHEN (NORCO/VICODIN) 5-325 MG PER TABLET    Take 2 tablets by mouth every 4 (four) hours as needed.   NAPROXEN (NAPROSYN) 500 MG TABLET    Take 1 tablet (500 mg total) by mouth 2 (two) times daily with a meal.   PENICILLIN V POTASSIUM (VEETID) 500 MG TABLET    Take 1 tablet (500 mg total) by mouth 4 (four) times daily.     I personally performed the services described in this documentation, which was scribed in my presence. The recorded information has been reviewed and is accurate.        Vida RollerBrian D Valente Fosberg, MD 10/16/14 [phone]

## 2015-04-10 ENCOUNTER — Encounter (HOSPITAL_COMMUNITY): Payer: Self-pay

## 2015-04-10 ENCOUNTER — Emergency Department (HOSPITAL_COMMUNITY)
Admission: EM | Admit: 2015-04-10 | Discharge: 2015-04-10 | Disposition: A | Discharge status: Home / Self Care | Payer: BLUE CROSS/BLUE SHIELD | Attending: Emergency Medicine | Admitting: Emergency Medicine

## 2015-04-10 ENCOUNTER — Emergency Department (HOSPITAL_COMMUNITY): Payer: BLUE CROSS/BLUE SHIELD

## 2015-04-10 DIAGNOSIS — R52 Pain, unspecified: Secondary | ICD-10-CM | POA: Diagnosis present

## 2015-04-10 DIAGNOSIS — J40 Bronchitis, not specified as acute or chronic: Secondary | ICD-10-CM

## 2015-04-10 DIAGNOSIS — Z79899 Other long term (current) drug therapy: Secondary | ICD-10-CM | POA: Insufficient documentation

## 2015-04-10 DIAGNOSIS — Z792 Long term (current) use of antibiotics: Secondary | ICD-10-CM | POA: Insufficient documentation

## 2015-04-10 DIAGNOSIS — E119 Type 2 diabetes mellitus without complications: Secondary | ICD-10-CM | POA: Insufficient documentation

## 2015-04-10 DIAGNOSIS — J209 Acute bronchitis, unspecified: Secondary | ICD-10-CM | POA: Insufficient documentation

## 2015-04-10 DIAGNOSIS — M1990 Unspecified osteoarthritis, unspecified site: Secondary | ICD-10-CM | POA: Insufficient documentation

## 2015-04-10 DIAGNOSIS — Z794 Long term (current) use of insulin: Secondary | ICD-10-CM | POA: Insufficient documentation

## 2015-04-10 DIAGNOSIS — F329 Major depressive disorder, single episode, unspecified: Secondary | ICD-10-CM | POA: Diagnosis not present

## 2015-04-10 DIAGNOSIS — Z87442 Personal history of urinary calculi: Secondary | ICD-10-CM | POA: Diagnosis not present

## 2015-04-10 DIAGNOSIS — Z720 Tobacco use: Secondary | ICD-10-CM | POA: Insufficient documentation

## 2015-04-10 DIAGNOSIS — Z87448 Personal history of other diseases of urinary system: Secondary | ICD-10-CM | POA: Insufficient documentation

## 2015-04-10 DIAGNOSIS — Z791 Long term (current) use of non-steroidal anti-inflammatories (NSAID): Secondary | ICD-10-CM | POA: Insufficient documentation

## 2015-04-10 LAB — CBC WITH DIFFERENTIAL/PLATELET
BASOS ABS: 0.1 10*3/uL (ref 0.0–0.1)
BASOS PCT: 1 % (ref 0–1)
Eosinophils Absolute: 0.2 10*3/uL (ref 0.0–0.7)
Eosinophils Relative: 3 % (ref 0–5)
HCT: 44.4 % (ref 39.0–52.0)
Hemoglobin: 15.3 g/dL (ref 13.0–17.0)
Lymphocytes Relative: 34 % (ref 12–46)
Lymphs Abs: 2.8 10*3/uL (ref 0.7–4.0)
MCH: 29.5 pg (ref 26.0–34.0)
MCHC: 34.5 g/dL (ref 30.0–36.0)
MCV: 85.7 fL (ref 78.0–100.0)
MONO ABS: 0.5 10*3/uL (ref 0.1–1.0)
Monocytes Relative: 6 % (ref 3–12)
Neutro Abs: 4.7 10*3/uL (ref 1.7–7.7)
Neutrophils Relative %: 56 % (ref 43–77)
PLATELETS: 190 10*3/uL (ref 150–400)
RBC: 5.18 MIL/uL (ref 4.22–5.81)
RDW: 12.9 % (ref 11.5–15.5)
WBC: 8.3 10*3/uL (ref 4.0–10.5)

## 2015-04-10 LAB — URINALYSIS, ROUTINE W REFLEX MICROSCOPIC
Bilirubin Urine: NEGATIVE
Glucose, UA: >1000 mg/dL — AB
Hgb urine dipstick: NEGATIVE
Ketones, ur: NEGATIVE mg/dL
LEUKOCYTES UA: NEGATIVE
Nitrite: NEGATIVE
PROTEIN: NEGATIVE mg/dL
Specific Gravity, Urine: 1.01 (ref 1.005–1.030)
Urobilinogen, UA: 0.2 mg/dL (ref 0.0–1.0)
pH: 5.5 (ref 5.0–8.0)

## 2015-04-10 LAB — COMPREHENSIVE METABOLIC PANEL
ALT: 20 U/L (ref 17–63)
AST: 14 U/L — ABNORMAL LOW (ref 15–41)
Albumin: 4.1 g/dL (ref 3.5–5.0)
Alkaline Phosphatase: 114 U/L (ref 38–126)
Anion gap: 11 (ref 5–15)
BUN: 33 mg/dL — ABNORMAL HIGH (ref 6–20)
CO2: 25 mmol/L (ref 22–32)
Calcium: 9.6 mg/dL (ref 8.9–10.3)
Chloride: 94 mmol/L — ABNORMAL LOW (ref 101–111)
Creatinine, Ser: 0.95 mg/dL (ref 0.61–1.24)
GFR calc Af Amer: >60 mL/min (ref >60)
GFR calc non Af Amer: >60 mL/min (ref >60)
GLUCOSE: 450 mg/dL — AB (ref 65–99)
POTASSIUM: 4.3 mmol/L (ref 3.5–5.1)
Sodium: 130 mmol/L — ABNORMAL LOW (ref 135–145)
Total Bilirubin: 0.7 mg/dL (ref 0.3–1.2)
Total Protein: 7.7 g/dL (ref 6.5–8.1)

## 2015-04-10 LAB — URINE MICROSCOPIC-ADD ON

## 2015-04-10 LAB — CBG MONITORING, ED: GLUCOSE-CAPILLARY: 274 mg/dL — AB (ref 65–99)

## 2015-04-10 MED ORDER — KETOROLAC TROMETHAMINE 30 MG/ML IJ SOLN
30.0000 mg | Freq: Once | INTRAMUSCULAR | Status: AC
  Administered 2015-04-10: 30 mg via INTRAVENOUS
  Filled 2015-04-10: qty 1

## 2015-04-10 MED ORDER — AZITHROMYCIN 250 MG PO TABS: ORAL_TABLET | ORAL | Status: DC

## 2015-04-10 MED ORDER — ONDANSETRON 4 MG PO TBDP: ORAL_TABLET | ORAL | Status: DC

## 2015-04-10 MED ORDER — SODIUM CHLORIDE 0.9 % IV BOLUS (SEPSIS)
1000.0000 mL | Freq: Once | INTRAVENOUS | Status: AC
  Administered 2015-04-10: 1000 mL via INTRAVENOUS

## 2015-04-10 MED ORDER — HYDROCODONE-ACETAMINOPHEN 5-325 MG PO TABS: 1.0000 | ORAL_TABLET | Freq: Four times a day (QID) | ORAL | Status: DC | PRN

## 2015-04-10 NOTE — ED Notes (Signed)
Feeling bad all week. On Wednesday symptoms worsened. All joints aching. Some vomiting. No diarrhea. Blood sugar 438 this am

## 2015-04-10 NOTE — ED Notes (Signed)
CBG 274 

## 2015-04-10 NOTE — Discharge Instructions (Signed)
Drink plenty of fluids and follow up with your md this week. °

## 2015-04-10 NOTE — ED Provider Notes (Signed)
CSN: [id number]     Arrival date & time 04/10/15  0950 History  This chart was scribed for Bethann BerkshireJoseph Axyl Sitzman, MD by Elon SpannerGarrett Cook, ED Scribe. This patient was seen in room APA19/APA19 and the patient's care was started at 9:59 AM.   No chief complaint on file.  The history is provided by the patient. No language interpreter was used.   HPI Comments: [name] is a 54 y.o. male who presents to the Emergency Department complaining of generalized body aches, diaphoresis, cough, decreased appetite, and sore throat worse with swallowing.  Patient denies urinary complaints.  His blood sugar was measured this morning at 438.  Past Medical History  Diagnosis Date  . Diabetes mellitus   . Depression   . Arthritis   . Renal disorder     kidney stones   Past Surgical History  Procedure Laterality Date  . Hernia repair    . Knee surgery    . Finger amputation    . Total knee arthroplasty      Right knee  . Varicose vein surgery    . Meniscus repair      left and right   No family history on file. History  Substance Use Topics  . Smoking status: Current Every Day Smoker -- 1.00 packs/day    Types: Cigarettes  . Smokeless tobacco: Not on file  . Alcohol Use: No    Review of Systems  Constitutional: Positive for diaphoresis and appetite change. Negative for fatigue.  HENT: Positive for sore throat. Negative for congestion, ear discharge and sinus pressure.   Eyes: Negative for discharge.  Respiratory: Positive for cough.   Cardiovascular: Negative for chest pain.  Gastrointestinal: Negative for abdominal pain and diarrhea.  Genitourinary: Negative for frequency and hematuria.  Musculoskeletal: Positive for myalgias. Negative for back pain.  Skin: Negative for rash.  Neurological: Negative for seizures and headaches.  Psychiatric/Behavioral: Negative for hallucinations.      Allergies  Ciprofloxacin-ciprofloxacin hcl  Home Medications   Prior to Admission medications    Medication Sig Start Date End Date Taking? Authorizing Provider  APIDRA SOLOSTAR 100 UNIT/ML SOPN Inject 16 Units into the skin 3 (three) times daily before meals.  06/23/13   Historical Provider, MD  cyclobenzaprine (FLEXERIL) 5 MG tablet Take 1 tablet (5 mg total) by mouth 3 (three) times daily as needed for muscle spasms. Patient not taking: Reported on 04/10/2015 08/15/13   Burgess AmorJulie Idol, PA-C  dextroamphetamine (DEXEDRINE SPANSULE) 15 MG 24 hr capsule Take 15 mg by mouth 2 (two) times daily.      Historical Provider, MD  diazepam (VALIUM) 10 MG tablet Take 1 tablet by mouth daily as needed for sleep.  05/12/13   Historical Provider, MD  diclofenac (VOLTAREN) 75 MG EC tablet Take 1 tablet (75 mg total) by mouth 2 (two) times daily. Patient not taking: Reported on 04/10/2015 03/15/14   Ivery QualeHobson Bryant, PA-C  HYDROcodone-acetaminophen (NORCO/VICODIN) 5-325 MG per tablet Take 2 tablets by mouth every 4 (four) hours as needed. Patient not taking: Reported on 04/10/2015 10/16/14   Eber HongBrian Miller, MD  ibuprofen (ADVIL,MOTRIN) 600 MG tablet Take 1 tablet (600 mg total) by mouth every 6 (six) hours as needed for pain. Patient not taking: Reported on 04/10/2015 08/15/13   Burgess AmorJulie Idol, PA-C  insulin glargine (LANTUS SOLOSTAR) 100 UNIT/ML injection Inject 60 Units into the skin at bedtime.     Historical Provider, MD  lamoTRIgine (LAMICTAL) 5 MG CHEW Chew 10 mg by mouth daily.  Historical Provider, MD  metFORMIN (GLUCOPHAGE) 500 MG tablet Take 1,000 mg by mouth every evening.      Historical Provider, MD  naphazoline (CLEAR EYES) 0.012 % ophthalmic solution Place 1 drop into both eyes daily as needed. For relief     Historical Provider, MD  naproxen (NAPROSYN) 500 MG tablet Take 1 tablet (500 mg total) by mouth 2 (two) times daily with a meal. Patient not taking: Reported on 04/10/2015 10/16/14   Eber Hong, MD  penicillin v potassium (VEETID) 500 MG tablet Take 1 tablet (500 mg total) by mouth 4 (four) times  daily. Patient not taking: Reported on 04/10/2015 10/16/14   Eber Hong, MD  sertraline (ZOLOFT) 50 MG tablet Take 50 mg by mouth every evening.      Historical Provider, MD  traMADol (ULTRAM) 50 MG tablet Take 1 tablet (50 mg total) by mouth every 6 (six) hours as needed for pain. Patient not taking: Reported on 04/10/2015 08/31/13   Eber Hong, MD   There were no vitals taken for this visit. Physical Exam  Constitutional: He is oriented to person, place, and time. He appears well-developed.  HENT:  Head: Normocephalic.  Eyes: Conjunctivae and EOM are normal. No scleral icterus.  Neck: Neck supple. No thyromegaly present.  Cardiovascular: Normal rate and regular rhythm.  Exam reveals no gallop and no friction rub.   No murmur heard. Pulmonary/Chest: No stridor. He has no wheezes. He has no rales. He exhibits no tenderness.  Abdominal: He exhibits no distension. There is no tenderness. There is no rebound.  Musculoskeletal: Normal range of motion. He exhibits no edema.  Lymphadenopathy:    He has no cervical adenopathy.  Neurological: He is oriented to person, place, and time. He exhibits normal muscle tone. Coordination normal.  Skin: No rash noted. No erythema.  Psychiatric: He has a normal mood and affect. His behavior is normal.  Nursing note and vitals reviewed.   ED Course  Procedures (including critical care time)  DIAGNOSTIC STUDIES: Oxygen Saturation is 95% on RA, adqeuate by my interpretation.    COORDINATION OF CARE:  10:07 AM Discussed treatment plan with patient at bedside.  Patient acknowledges and agrees with plan.    Labs Review Labs Reviewed - No data to display  Imaging Review No results found.   EKG Interpretation None      MDM   Final diagnoses:  None    Bronchitis,  Diabetes poorly controled The chart was scribed for me under my direct supervision.  I personally performed the history, physical, and medical decision making and all procedures  in the evaluation of this patient.Bethann Berkshire, MD 04/10/15 [phone]

## 2015-09-07 ENCOUNTER — Emergency Department (HOSPITAL_COMMUNITY)
Admission: EM | Admit: 2015-09-07 | Discharge: 2015-09-07 | Disposition: A | Discharge status: Home / Self Care | Payer: BLUE CROSS/BLUE SHIELD | Attending: Emergency Medicine | Admitting: Emergency Medicine

## 2015-09-07 ENCOUNTER — Emergency Department (HOSPITAL_COMMUNITY): Payer: BLUE CROSS/BLUE SHIELD

## 2015-09-07 ENCOUNTER — Encounter (HOSPITAL_COMMUNITY): Payer: Self-pay | Admitting: Emergency Medicine

## 2015-09-07 ENCOUNTER — Emergency Department (HOSPITAL_BASED_OUTPATIENT_CLINIC_OR_DEPARTMENT_OTHER): Payer: BLUE CROSS/BLUE SHIELD

## 2015-09-07 DIAGNOSIS — R079 Chest pain, unspecified: Secondary | ICD-10-CM

## 2015-09-07 DIAGNOSIS — E119 Type 2 diabetes mellitus without complications: Secondary | ICD-10-CM | POA: Diagnosis not present

## 2015-09-07 DIAGNOSIS — M1990 Unspecified osteoarthritis, unspecified site: Secondary | ICD-10-CM | POA: Insufficient documentation

## 2015-09-07 DIAGNOSIS — Z87442 Personal history of urinary calculi: Secondary | ICD-10-CM | POA: Diagnosis not present

## 2015-09-07 DIAGNOSIS — F329 Major depressive disorder, single episode, unspecified: Secondary | ICD-10-CM | POA: Diagnosis not present

## 2015-09-07 DIAGNOSIS — Z720 Tobacco use: Secondary | ICD-10-CM | POA: Insufficient documentation

## 2015-09-07 DIAGNOSIS — Z79899 Other long term (current) drug therapy: Secondary | ICD-10-CM | POA: Insufficient documentation

## 2015-09-07 DIAGNOSIS — R0600 Dyspnea, unspecified: Secondary | ICD-10-CM

## 2015-09-07 DIAGNOSIS — R1012 Left upper quadrant pain: Secondary | ICD-10-CM | POA: Diagnosis not present

## 2015-09-07 DIAGNOSIS — R0789 Other chest pain: Secondary | ICD-10-CM

## 2015-09-07 DIAGNOSIS — R911 Solitary pulmonary nodule: Secondary | ICD-10-CM | POA: Diagnosis not present

## 2015-09-07 LAB — APTT: aPTT: 28 seconds (ref 24–37)

## 2015-09-07 LAB — COMPREHENSIVE METABOLIC PANEL
ALBUMIN: 4.4 g/dL (ref 3.5–5.0)
ALK PHOS: 102 U/L (ref 38–126)
ALT: 16 U/L — AB (ref 17–63)
AST: 17 U/L (ref 15–41)
Anion gap: 12 (ref 5–15)
BILIRUBIN TOTAL: 0.8 mg/dL (ref 0.3–1.2)
BUN: 31 mg/dL — AB (ref 6–20)
CALCIUM: 9.7 mg/dL (ref 8.9–10.3)
CO2: 25 mmol/L (ref 22–32)
Chloride: 96 mmol/L — ABNORMAL LOW (ref 101–111)
Creatinine, Ser: 1.13 mg/dL (ref 0.61–1.24)
GFR calc Af Amer: >60 mL/min (ref >60)
GFR calc non Af Amer: >60 mL/min (ref >60)
GLUCOSE: 346 mg/dL — AB (ref 65–99)
Potassium: 3.7 mmol/L (ref 3.5–5.1)
Sodium: 133 mmol/L — ABNORMAL LOW (ref 135–145)
TOTAL PROTEIN: 8.1 g/dL (ref 6.5–8.1)

## 2015-09-07 LAB — CBC WITH DIFFERENTIAL/PLATELET
BASOS PCT: 1 %
Basophils Absolute: 0.1 10*3/uL (ref 0.0–0.1)
Eosinophils Absolute: 0.1 10*3/uL (ref 0.0–0.7)
Eosinophils Relative: 1 %
HEMATOCRIT: 44.9 % (ref 39.0–52.0)
Hemoglobin: 16.3 g/dL (ref 13.0–17.0)
Lymphocytes Relative: 28 %
Lymphs Abs: 3.1 10*3/uL (ref 0.7–4.0)
MCH: 30.6 pg (ref 26.0–34.0)
MCHC: 36.3 g/dL — AB (ref 30.0–36.0)
MCV: 84.4 fL (ref 78.0–100.0)
MONO ABS: 0.7 10*3/uL (ref 0.1–1.0)
MONOS PCT: 7 %
NEUTROS ABS: 6.9 10*3/uL (ref 1.7–7.7)
NEUTROS PCT: 63 %
Platelets: 261 10*3/uL (ref 150–400)
RBC: 5.32 MIL/uL (ref 4.22–5.81)
RDW: 13.1 % (ref 11.5–15.5)
WBC: 10.9 10*3/uL — ABNORMAL HIGH (ref 4.0–10.5)

## 2015-09-07 LAB — PROTIME-INR
INR: 1.1 (ref 0.00–1.49)
PROTHROMBIN TIME: 14.4 s (ref 11.6–15.2)

## 2015-09-07 LAB — TROPONIN I: Troponin I: <0.03 ng/mL (ref <0.031)

## 2015-09-07 LAB — D-DIMER, QUANTITATIVE (NOT AT ARMC): D DIMER QUANT: 0.56 ug{FEU}/mL — AB (ref 0.00–0.48)

## 2015-09-07 LAB — LIPASE, BLOOD: LIPASE: 63 U/L — AB (ref 22–51)

## 2015-09-07 LAB — BRAIN NATRIURETIC PEPTIDE: B NATRIURETIC PEPTIDE 5: 12 pg/mL (ref 0.0–100.0)

## 2015-09-07 MED ORDER — IOHEXOL 300 MG/ML  SOLN
100.0000 mL | Freq: Once | INTRAMUSCULAR | Status: AC | PRN
  Administered 2015-09-07: 100 mL via INTRAVENOUS

## 2015-09-07 MED ORDER — KETOROLAC TROMETHAMINE 30 MG/ML IJ SOLN
30.0000 mg | Freq: Once | INTRAMUSCULAR | Status: AC
  Administered 2015-09-07: 30 mg via INTRAVENOUS
  Filled 2015-09-07: qty 1

## 2015-09-07 MED ORDER — IOHEXOL 300 MG/ML  SOLN
25.0000 mL | Freq: Once | INTRAMUSCULAR | Status: AC | PRN
  Administered 2015-09-07: 25 mL via ORAL

## 2015-09-07 MED ORDER — NITROGLYCERIN 2 % TD OINT
1.0000 [in_us] | TOPICAL_OINTMENT | Freq: Once | TRANSDERMAL | Status: AC
  Administered 2015-09-07: 1 [in_us] via TOPICAL
  Filled 2015-09-07: qty 1

## 2015-09-07 NOTE — ED Notes (Signed)
Pt called this nurse in room and statde he was having a severe burning pain to left upper quadrant and wanted the EDP to be aware that he was diagnosed pericarditis in the 70's. Dr. Lynelle DoctorKnapp informed of information pt gave to nurse.

## 2015-09-07 NOTE — ED Notes (Signed)
Echo at bedside

## 2015-09-07 NOTE — ED Provider Notes (Signed)
[name] from Dr. Lynelle DoctorKnapp at 7 AM. Patient is a type I diabetic with insulin pump present with 2 day history of constant left-sided chest pain described as tightness. EKG is reassuring. Troponin is negative. CT angiogram pending. CT shows no evidence of PE. Patient informed of lung nodule need for 1 year follow-up. Patient also has abnormal appearing cecum on his abdominal CT needs to have a colonoscopy.  Patient feels improved. Second troponin pending. Echocardiogram has been done. He has had exertional shortness of breath for several weeks. He's never had a stress test. His pain is much improved after Toradol he says it is completely gone when he lies down but worse with sitting up. this appears atypical for ACS.  Second troponin negative. D/w Dr. Purvis SheffieldKoneswaran.  He agrees symptoms are atypical for ACS with 2 days of constant pain. Echocardiogram is normal with normal ejection fraction. He will arrange for outpatient stress test.  Patient made aware of incidental findings including lung nodule, abnormal cecum and thyroid enlargement. Follow up with PCP and GI.  Cardiology appointment made October 26 at 2:20 PM. Patient aware.  Glynn OctaveStephen Caprina Wussow, MD 09/07/15 [phone]

## 2015-09-07 NOTE — ED Provider Notes (Addendum)
CSN: [id number]     Arrival date & time 09/07/15  4098 History   First MD Initiated Contact with Patient 09/07/15 [phone]     Chief Complaint  Patient presents with  . Chest Pain     (Consider location/radiation/quality/duration/timing/severity/associated sxs/prior Treatment) HPI  Patient states during the summer he started having some chest discomfort off and on. He states about a week ago it started happening more persistently. He states he would come and go. He states he would last for a few hours up to all day long. He states however the past 2 days the pain has been constant. He describes the pain as tightness. He states any kind of activity especially when he does his job at work makes the pain come on or feel worse. 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 He states when he has the pain nothing he does makes it feel better except to lay down. He states also when he stands up he feels short of breath and feels like he's going to pass out. He also has dyspnea on exertion. He states even taking a shower will make him short of breath. He states this morning about 3 AM he was sweating profusely and had nausea without vomiting. He states the chest pain seemed to be getting worse. He states he thought with the profuse sweating was because his blood sugar had dropped however his blood sugar was in the 300s. He states the pain does not radiate. He has chronic swelling of his right leg status post knee surgery and venous stripping. 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 He denies pain in his leg today.He states earlier today his pain was a 7-8 out of 10, he states currently is 4-5 out of 10. He did take 2 Goody powders at 4:30 AM. He states he told his endocrinologist about this pain earlier this summer however no testing was done. He has an insulin pump. He states he has felt so bad he has missed work for the past  week. Patient also states he had a history of pericarditis many years ago.  Family history he states his father died at age 58 in his sleep if it was presumed he had MI. He states his mother died at age 53 from kidney disease after getting the wrong blood type in a transfusion. He is unaware of any other heart disease in the family.    PCP Dr Harland Dingwall in [place], [place] Endocrinologist Dr Tawanna Solo at [facility]  Past Medical History  Diagnosis Date  . Diabetes mellitus   . Depression   . Arthritis   . Renal disorder     kidney stones   Past Surgical History  Procedure Laterality Date  . Hernia repair    . Knee surgery    . Finger amputation    . Total knee arthroplasty      Right knee  . Varicose vein surgery    . Meniscus repair      left and right   History reviewed. No pertinent family history. Social History  Substance Use Topics  . Smoking status: Current Every Day Smoker -- 1.00 packs/day    Types: Cigarettes  . Smokeless tobacco: None  . Alcohol Use: No  employed Lives at home Lives with spouse  Review of Systems  All other systems reviewed and are negative.  Allergies  Ciprofloxacin-ciprofloxacin hcl  Home Medications   Prior to Admission medications   Medication Sig Start Date End Date Taking? Authorizing Provider  amphetamine-dextroamphetamine (ADDERALL XR) 15 MG 24 hr capsule Take 15 mg by mouth daily.   Yes Historical Provider, MD  diazepam (VALIUM) 10 MG tablet Take 1 tablet by mouth daily as needed for sleep.  05/12/13  Yes Historical Provider, MD  lamoTRIgine (LAMICTAL) 25 MG tablet Take 75 mg by mouth daily.   Yes Historical Provider, MD  Multiple Vitamin (MULTIVITAMIN WITH MINERALS) TABS tablet Take 1 tablet by mouth daily.   Yes Historical Provider, MD  naphazoline (CLEAR EYES) 0.012 % ophthalmic solution Place 1 drop into both eyes daily as needed. For relief    Yes Historical Provider, MD  sertraline (ZOLOFT) 50 MG tablet Take 50 mg by mouth every  evening.     Yes Historical Provider, MD  APIDRA SOLOSTAR 100 UNIT/ML SOPN Inject into the skin 3 (three) times daily before meals. Per sliding scale. 06/23/13   Historical Provider, MD  Aspirin-Acetaminophen (GOODYS BODY PAIN PO) Take 1 Package by mouth daily as needed (pain).    Historical Provider, MD  azithromycin (ZITHROMAX Z-PAK) 250 MG tablet 2 po day one, then 1 daily x 4 days 04/10/15   Bethann Berkshire, MD  cyclobenzaprine (FLEXERIL) 5 MG tablet Take 1 tablet (5 mg total) by mouth 3 (three) times daily as needed for muscle spasms. Patient not taking: Reported on 04/10/2015 08/15/13   Burgess Amor, PA-C  dextroamphetamine (DEXTROSTAT) 10 MG tablet Take 10 mg by mouth daily.    Historical Provider, MD  diclofenac (VOLTAREN) 75 MG EC tablet Take 1 tablet (75 mg total) by mouth 2 (two) times daily. Patient not taking: Reported on 04/10/2015 03/15/14   Ivery Quale, PA-C  HYDROcodone-acetaminophen (NORCO/VICODIN) 5-325 MG per tablet Take 1 tablet by mouth every 6 (six) hours as needed. 04/10/15   Bethann Berkshire, MD  ibuprofen (ADVIL,MOTRIN) 600 MG tablet Take 1 tablet (600 mg total) by mouth every 6 (six) hours as needed for pain. Patient not taking: Reported on 04/10/2015 08/15/13   Burgess Amor, PA-C  naproxen (NAPROSYN) 500 MG tablet Take 1 tablet (500 mg total) by mouth 2 (two) times daily with a meal. Patient not taking: Reported on 04/10/2015 10/16/14   Eber Hong, MD  ondansetron (ZOFRAN ODT) 4 MG disintegrating tablet 4mg  ODT q4 hours prn nausea/vomit 04/10/15   Bethann Berkshire, MD  penicillin v potassium (VEETID) 500 MG tablet Take 1 tablet (500 mg total) by mouth 4 (four) times daily. Patient not taking: Reported on 04/10/2015 10/16/14   Eber Hong, MD  traMADol (ULTRAM) 50 MG tablet Take 1 tablet (50 mg total) by mouth every 6 (six) hours as needed for pain. Patient not taking: Reported on 04/10/2015 08/31/13   Eber Hong, MD   BP 158/96 mmHg  Pulse 108  Temp(Src) 98.2 F (36.8 C)  Resp 18   Ht 5\' 10"  (1.778 m)  Wt 195 lb (88.451 kg)  BMI 27.98 kg/m2  SpO2 100%  Vital signs normal except for tachycardia  Physical Exam  Constitutional: He is oriented to person, place, and time. He appears well-developed and well-nourished.  Non-toxic appearance. He does not appear ill. No distress.  HENT:  Head: Normocephalic and atraumatic.  Right Ear: External ear normal.  Left Ear: External ear normal.  Nose: Nose normal. No mucosal edema or rhinorrhea.  Mouth/Throat: Oropharynx is clear and moist and mucous membranes are normal. No dental abscesses or  uvula swelling.  Eyes: Conjunctivae and EOM are normal. Pupils are equal, round, and reactive to light.  Neck: Normal range of motion and full passive range of motion without pain. Neck supple.  Cardiovascular: Normal rate, regular rhythm and normal heart sounds.  Exam reveals no gallop and no friction rub.   No murmur heard. Pulmonary/Chest: Effort normal and breath sounds normal. No respiratory distress. He has no wheezes. He has no rhonchi. He has no rales. He exhibits no tenderness and no crepitus.    Area of pain noted, nontender  Abdominal: Soft. Normal appearance and bowel sounds are normal. He exhibits no distension. There is no tenderness. There is no rebound and no guarding.  Musculoskeletal: Normal range of motion. He exhibits no edema or tenderness.  Moves all extremities well.   Neurological: He is alert and oriented to person, place, and time. He has normal strength. No cranial nerve deficit.  Skin: Skin is warm, dry and intact. No rash noted. No erythema. No pallor.  Psychiatric: He has a normal mood and affect. His speech is normal and behavior is normal. His mood appears not anxious.  Nursing note and vitals reviewed.   ED Course  Procedures (including critical care time)  Medications  nitroGLYCERIN (NITROGLYN) 2 % ointment 1 inch (1 inch Topical Given 09/07/15 0624)  ketorolac (TORADOL) 30 MG/ML injection 30 mg (30  mg Intravenous Given 09/07/15 0728)    Patient states he took 2 Goody powders at 4:30 AM. He was not given oral aspirin.   Recheck at 7:20 AM patient states his pain is improving. He states is currently a 3 out of 10. We went over his test results. We discussed doing CT angiogram chest to rule out PE, and CT abdomen due to mildly elevated lipase. The pancreas does sit right underneath the left lower chest where he has pain. Patient was given his CBG on his blood work. He did a bolus of his insulin on his insulin pump. He was given IV Toradol for his persistent pain.  07:30 Pt turned over to Dr Manus Gunningancour at change of shift to get results of CT scans.   Labs Review Results for orders placed or performed during the hospital encounter of 09/07/15  Comprehensive metabolic panel  Result Value Ref Range   Sodium 133 (L) 135 - 145 mmol/L   Potassium 3.7 3.5 - 5.1 mmol/L   Chloride 96 (L) 101 - 111 mmol/L   CO2 25 22 - 32 mmol/L   Glucose, Bld 346 (H) 65 - 99 mg/dL   BUN 31 (H) 6 - 20 mg/dL   Creatinine, Ser 1.611.13 0.61 - 1.24 mg/dL   Calcium 9.7 8.9 - 09.610.3 mg/dL   Total Protein 8.1 6.5 - 8.1 g/dL   Albumin 4.4 3.5 - 5.0 g/dL   AST 17 15 - 41 U/L   ALT 16 (L) 17 - 63 U/L   Alkaline Phosphatase 102 38 - 126 U/L   Total Bilirubin 0.8 0.3 - 1.2 mg/dL   GFR calc non Af Amer >60 >60 mL/min   GFR calc Af Amer >60 >60 mL/min   Anion gap 12 5 - 15  CBC with Differential  Result Value Ref Range   WBC 10.9 (H) 4.0 - 10.5 K/uL   RBC 5.32 4.22 - 5.81 MIL/uL   Hemoglobin 16.3 13.0 - 17.0 g/dL   HCT 04.544.9 40.939.0 - 81.152.0 %   MCV 84.4 78.0 - 100.0 fL   MCH 30.6 26.0 - 34.0 pg  MCHC 36.3 (H) 30.0 - 36.0 g/dL   RDW 40.9 81.1 - 91.4 %   Platelets 261 150 - 400 K/uL   Neutrophils Relative % 63 %   Neutro Abs 6.9 1.7 - 7.7 K/uL   Lymphocytes Relative 28 %   Lymphs Abs 3.1 0.7 - 4.0 K/uL   Monocytes Relative 7 %   Monocytes Absolute 0.7 0.1 - 1.0 K/uL   Eosinophils Relative 1 %   Eosinophils Absolute 0.1  0.0 - 0.7 K/uL   Basophils Relative 1 %   Basophils Absolute 0.1 0.0 - 0.1 K/uL  D-dimer, quantitative  Result Value Ref Range   D-Dimer, Quant 0.56 (H) 0.00 - 0.48 ug/mL-FEU  Troponin I  Result Value Ref Range   Troponin I <0.03 <0.031 ng/mL  Brain natriuretic peptide  Result Value Ref Range   B Natriuretic Peptide 12.0 0.0 - 100.0 pg/mL  Protime-INR  Result Value Ref Range   Prothrombin Time 14.4 11.6 - 15.2 seconds   INR 1.10 0.00 - 1.49  APTT  Result Value Ref Range   aPTT 28 24 - 37 seconds  Lipase, blood  Result Value Ref Range   Lipase 63 (H) 22 - 51 U/L   Laboratory interpretation all normal except leukocytosis, hyperglycemia, elevated d-dimer, mildly elevated lipase     Imaging Review Dg Chest Port 1 View  09/07/2015  CLINICAL DATA:  Acute onset of generalized chest pain and tachycardia. Initial encounter. EXAM: PORTABLE CHEST 1 VIEW COMPARISON:  Chest radiograph performed 04/10/2015 FINDINGS: The lungs are well-aerated and clear. There is no evidence of focal opacification, pleural effusion or pneumothorax. The cardiomediastinal silhouette is within normal limits. No acute osseous abnormalities are seen. IMPRESSION: No acute cardiopulmonary process seen. Electronically Signed   By: Roanna Raider M.D.   On: 09/07/2015 06:46   I have personally reviewed and evaluated these images and lab results as part of my medical decision-making.   EKG Interpretation   Date/Time:  Tuesday September 07 2015 06:03:13 EDT Ventricular Rate:  106 PR Interval:  140 QRS Duration: 88 QT Interval:  336 QTC Calculation: 446 R Axis:   38 Text Interpretation:  Sinus tachycardia Otherwise within normal limits  Since last tracing rate faster (18 Sep 2011) Confirmed by [facility]  MD-I, Judy Pollman  ([id number]) on 09/07/2015 6:07:04 AM      MDM  PT has hx of diabetes on insulin pump who smokes with chest pain and SOB that is exertional the past week, constant the past 2 days with episode of diaphoresis  at 3 am this morning. He had pericarditis at age 75.    Final diagnoses:  Chest pain    Disposition pending   Devoria Albe, MD, Concha Pyo, MD 09/07/15 [id number]  Devoria Albe, MD 09/07/15 [phone]

## 2015-09-07 NOTE — Discharge Instructions (Signed)
Nonspecific Chest Pain  There is no evidence of heart attack or blood clot in your lung. Follow-up with a cardiologist for a stress test. Also follow up with your regular doctor regarding your enlarged thyroid and lung nodule. You need to have a CT scan of your chest in 1 year. Also follow up with the gastroenterologist regarding the abnormal part of your colon. Return to the ED if he develop new or worsening symptoms. Chest pain can be caused by many different conditions. There is always a chance that your pain could be related to something serious, such as a heart attack or a blood clot in your lungs. Chest pain can also be caused by conditions that are not life-threatening. If you have chest pain, it is very important to follow up with your health care provider. CAUSES  Chest pain can be caused by:  Heartburn.  Pneumonia or bronchitis.  Anxiety or stress.  Inflammation around your heart (pericarditis) or lung (pleuritis or pleurisy).  A blood clot in your lung.  A collapsed lung (pneumothorax). It can develop suddenly on its own (spontaneous pneumothorax) or from trauma to the chest.  Shingles infection (varicella-zoster virus).  Heart attack.  Damage to the bones, muscles, and cartilage that make up your chest wall. This can include:  Bruised bones due to injury.  Strained muscles or cartilage due to frequent or repeated coughing or overwork.  Fracture to one or more ribs.  Sore cartilage due to inflammation (costochondritis). RISK FACTORS  Risk factors for chest pain may include:  Activities that increase your risk for trauma or injury to your chest.  Respiratory infections or conditions that cause frequent coughing.  Medical conditions or overeating that can cause heartburn.  Heart disease or family history of heart disease.  Conditions or health behaviors that increase your risk of developing a blood clot.  Having had chicken pox (varicella zoster). SIGNS AND  SYMPTOMS Chest pain can feel like:  Burning or tingling on the surface of your chest or deep in your chest.  Crushing, pressure, aching, or squeezing pain.  Dull or sharp pain that is worse when you move, cough, or take a deep breath.  Pain that is also felt in your back, neck, shoulder, or arm, or pain that spreads to any of these areas. Your chest pain may come and go, or it may stay constant. DIAGNOSIS Lab tests or other studies may be needed to find the cause of your pain. Your health care provider may have you take a test called an ambulatory ECG (electrocardiogram). An ECG records your heartbeat patterns at the time the test is performed. You may also have other tests, such as:  Transthoracic echocardiogram (TTE). During echocardiography, sound waves are used to create a picture of all of the heart structures and to look at how blood flows through your heart.  Transesophageal echocardiogram (TEE).This is a more advanced imaging test that obtains images from inside your body. It allows your health care provider to see your heart in finer detail.  Cardiac monitoring. This allows your health care provider to monitor your heart rate and rhythm in real time.  Holter monitor. This is a portable device that records your heartbeat and can help to diagnose abnormal heartbeats. It allows your health care provider to track your heart activity for several days, if needed.  Stress tests. These can be done through exercise or by taking medicine that makes your heart beat more quickly.  Blood tests.  Imaging tests. TREATMENT  Your treatment depends on what is causing your chest pain. Treatment may include:  Medicines. These may include:  Acid blockers for heartburn.  Anti-inflammatory medicine.  Pain medicine for inflammatory conditions.  Antibiotic medicine, if an infection is present.  Medicines to dissolve blood clots.  Medicines to treat coronary artery disease.  Supportive  care for conditions that do not require medicines. This may include:  Resting.  Applying heat or cold packs to injured areas.  Limiting activities until pain decreases. HOME CARE INSTRUCTIONS  If you were prescribed an antibiotic medicine, finish it all even if you start to feel better.  Avoid any activities that bring on chest pain.  Do not use any tobacco products, including cigarettes, chewing tobacco, or electronic cigarettes. If you need help quitting, ask your health care provider.  Do not drink alcohol.  Take medicines only as directed by your health care provider.  Keep all follow-up visits as directed by your health care provider. This is important. This includes any further testing if your chest pain does not go away.  If heartburn is the cause for your chest pain, you may be told to keep your head raised (elevated) while sleeping. This reduces the chance that acid will go from your stomach into your esophagus.  Make lifestyle changes as directed by your health care provider. These may include:  Getting regular exercise. Ask your health care provider to suggest some activities that are safe for you.  Eating a heart-healthy diet. A registered dietitian can help you to learn healthy eating options.  Maintaining a healthy weight.  Managing diabetes, if necessary.  Reducing stress. SEEK MEDICAL CARE IF:  Your chest pain does not go away after treatment.  You have a rash with blisters on your chest.  You have a fever. SEEK IMMEDIATE MEDICAL CARE IF:   Your chest pain is worse.  You have an increasing cough, or you cough up blood.  You have severe abdominal pain.  You have severe weakness.  You faint.  You have chills.  You have sudden, unexplained chest discomfort.  You have sudden, unexplained discomfort in your arms, back, neck, or jaw.  You have shortness of breath at any time.  You suddenly start to sweat, or your skin gets clammy.  You feel  nauseous or you vomit.  You suddenly feel light-headed or dizzy.  Your heart begins to beat quickly, or it feels like it is skipping beats. These symptoms may represent a serious problem that is an emergency. Do not wait to see if the symptoms will go away. Get medical help right away. Call your local emergency services (911 in the [place] Do not drive yourself to the hospital.   This information is not intended to replace advice given to you by your health care provider. Make sure you discuss any questions you have with your health care provider.   Document Released: 08/16/2005 Document Revised: 11/27/2014 Document Reviewed: 06/12/2014 Elsevier Interactive Patient Education [employer].

## 2015-09-07 NOTE — ED Notes (Signed)
Pt c/o chest pain under the left breast that is associated with weakness, sob, n/v, and excessive sweating.

## 2015-09-15 ENCOUNTER — Encounter: Payer: BLUE CROSS/BLUE SHIELD | Admitting: Cardiovascular Disease

## 2015-09-28 ENCOUNTER — Encounter: Payer: BLUE CROSS/BLUE SHIELD | Admitting: Cardiovascular Disease

## 2016-03-04 ENCOUNTER — Emergency Department (HOSPITAL_COMMUNITY): Payer: BLUE CROSS/BLUE SHIELD

## 2016-03-04 ENCOUNTER — Encounter (HOSPITAL_COMMUNITY): Payer: Self-pay

## 2016-03-04 ENCOUNTER — Emergency Department (HOSPITAL_COMMUNITY)
Admission: EM | Admit: 2016-03-04 | Discharge: 2016-03-05 | Disposition: A | Discharge status: Home / Self Care | Payer: BLUE CROSS/BLUE SHIELD | Attending: Emergency Medicine | Admitting: Emergency Medicine

## 2016-03-04 DIAGNOSIS — F17210 Nicotine dependence, cigarettes, uncomplicated: Secondary | ICD-10-CM | POA: Diagnosis not present

## 2016-03-04 DIAGNOSIS — E119 Type 2 diabetes mellitus without complications: Secondary | ICD-10-CM | POA: Insufficient documentation

## 2016-03-04 DIAGNOSIS — M25562 Pain in left knee: Secondary | ICD-10-CM | POA: Diagnosis not present

## 2016-03-04 DIAGNOSIS — M1990 Unspecified osteoarthritis, unspecified site: Secondary | ICD-10-CM | POA: Diagnosis not present

## 2016-03-04 DIAGNOSIS — Z7982 Long term (current) use of aspirin: Secondary | ICD-10-CM | POA: Insufficient documentation

## 2016-03-04 DIAGNOSIS — F329 Major depressive disorder, single episode, unspecified: Secondary | ICD-10-CM | POA: Diagnosis not present

## 2016-03-04 DIAGNOSIS — Z79899 Other long term (current) drug therapy: Secondary | ICD-10-CM | POA: Insufficient documentation

## 2016-03-04 DIAGNOSIS — M25561 Pain in right knee: Secondary | ICD-10-CM | POA: Insufficient documentation

## 2016-03-04 MED ORDER — KETOROLAC TROMETHAMINE 30 MG/ML IJ SOLN
30.0000 mg | Freq: Once | INTRAMUSCULAR | Status: AC
  Administered 2016-03-04: 30 mg via INTRAMUSCULAR
  Filled 2016-03-04: qty 1

## 2016-03-04 NOTE — ED Provider Notes (Signed)
CSN: [id number]     Arrival date & time 03/04/16  2241 History  By signing my name below, I, Budd Palmer, attest that this documentation has been prepared under the direction and in the presence of Devoria Albe, MD. Electronically Signed: Budd Palmer, ED Scribe. 03/05/2016. 12:42 AM.     Chief Complaint  Patient presents with  . Knee Pain   The history is provided by the patient and the spouse. No language interpreter was used.   HPI Comments: [name] is a 55 y.o. male smoker at 1 ppd with a PMHx of arthritis and a PSHx of knee surgery and total right knee arthroplasty who presents to the Emergency Department complaining of chronic bilateral knee pain worsening significantly as of 7 hours ago after an injury at 4:30 pm today. He notes he typically has some soreness at baseline, and that the right side is typically worse than the left. His right knee is always larger than his left. Pt states he missed a step going down a ladder while painting in his home today, twisting the left knee to the side when his right foot came down on the ground, possible twisting or knocking both knees together. He states that since then the pain in his bilateral knees has worsened significantly. He also reports a PMHx of a torn meniscus in the left knee. He states his knee surgeon was Dr Nancy Fetter at [facility] for Rt TKR and revision, but notes he has not been seen there for the past 4 years after finishing his follow-ups. 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 He has seen Dr Hilda Lias in the past who did arthroscopy in his left knee. Pt is on an insulin pump.   PCP Dr Salvatore Decent in [facility]  Dr Simeon Craft at [facility] and Dr Hilda Lias [facility] Dr Anson Fret   Past Medical History  Diagnosis Date  . Diabetes mellitus   .  Depression   . Arthritis   . Renal disorder     kidney stones   Past Surgical History  Procedure Laterality Date  . Hernia repair    . Knee surgery    . Finger amputation    . Total knee arthroplasty      Right knee  . Varicose vein surgery    . Meniscus repair      left and right   No family history on file. Social History  Substance Use Topics  . Smoking status: Current Every Day Smoker -- 1.00 packs/day    Types: Cigarettes  . Smokeless tobacco: None  . Alcohol Use: No  smokes 2 ppd, down to 1ppd because no longer can smoke at work  Review of Systems  Musculoskeletal: Positive for joint swelling and arthralgias.  All other systems reviewed and are negative.   Allergies  Ciprofloxacin-ciprofloxacin hcl; Oxycodone-acetaminophen; and Zolpidem  Home Medications   Prior to Admission medications   Medication Sig Start Date End Date Taking? Authorizing Provider  amphetamine-dextroamphetamine (ADDERALL XR) 10 MG 24 hr capsule Take 10 mg by mouth every morning. 03/01/16  Yes Historical Provider, MD  amphetamine-dextroamphetamine (ADDERALL) 10 MG tablet Take 10 mg by mouth 2 (two) times daily. TAKE AT 1PM AND 4PM 03/01/16  Yes Historical Provider, MD  APIDRA 100 UNIT/ML injection 250 Units  by Pump Prime route daily. 01/22/16  Yes Historical Provider, MD  Aspirin-Acetaminophen (GOODYS BODY PAIN PO) Take 1 Package by mouth daily as needed (pain).   Yes Historical Provider, MD  Cinnamon 500 MG TABS Take 1 tablet by mouth daily.   Yes Historical Provider, MD  diazepam (VALIUM) 10 MG tablet Take 5-10 mg by mouth daily as needed for sleep.  05/12/13  Yes Historical Provider, MD  lamoTRIgine (LAMICTAL) 150 MG tablet Take 150 mg by mouth at bedtime.   Yes Historical Provider, MD  Multiple Vitamin (MULTIVITAMIN WITH MINERALS) TABS tablet Take 1 tablet by mouth daily.   Yes Historical Provider, MD  naphazoline (CLEAR EYES) 0.012 % ophthalmic solution Place 1 drop into both eyes daily as  needed. For relief    Yes Historical Provider, MD  polycarbophil (FIBERCON) 625 MG tablet Take 625 mg by mouth daily.   Yes Historical Provider, MD  Potassium Gluconate 595 MG CAPS Take 1 capsule by mouth daily.   Yes Historical Provider, MD  sertraline (ZOLOFT) 100 MG tablet Take 100 mg by mouth at bedtime. 02/11/16  Yes Historical Provider, MD  naproxen (NAPROSYN) 250 MG tablet Take 1 po BID with food prn pain 03/05/16   Devoria Albe, MD  traMADol (ULTRAM) 50 MG tablet Take 2 tablets (100 mg total) by mouth every 6 (six) hours as needed. 03/05/16   Devoria Albe, MD   BP 147/87 mmHg  Pulse 92  Temp(Src) 98.2 F (36.8 C) (Oral)  Resp 16  Ht 5\' 10"  (1.778 m)  Wt 195 lb (88.451 kg)  BMI 27.98 kg/m2  SpO2 96%  Vital signs normal   Physical Exam  Constitutional: He is oriented to person, place, and time. He appears well-developed and well-nourished.  Non-toxic appearance. He does not appear ill. No distress.  HENT:  Head: Normocephalic and atraumatic.  Right Ear: External ear normal.  Left Ear: External ear normal.  Nose: Nose normal. No mucosal edema or rhinorrhea.  Mouth/Throat: Mucous membranes are normal. No dental abscesses or uvula swelling.  Eyes: Conjunctivae and EOM are normal.  Neck: Normal range of motion and full passive range of motion without pain.  Pulmonary/Chest: Effort normal. No respiratory distress. He has no rhonchi. He exhibits no crepitus.  Abdominal: Soft. Normal appearance. There is no tenderness.  Musculoskeletal: Normal range of motion. He exhibits edema and tenderness.  Moves all extremities well. L knee: TTP over both joint spaces, patella and patellar tendon are not tender. R knee: larger than the left which is chronic, TTP over the patella, but not over the joint spaces. 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 He has normal strength. No cranial nerve deficit.  Skin: Skin is  warm, dry and intact. No rash noted. No erythema. No pallor.  Psychiatric: He has a normal mood and affect. His speech is normal and behavior is normal. His mood appears not anxious.  Nursing note and vitals reviewed.   ED Course  Procedures   Medications  ketorolac (TORADOL) 30 MG/ML injection 30 mg (30 mg Intramuscular Given 03/04/16 2358)    DIAGNOSTIC STUDIES: Oxygen Saturation is 96% on RA, adequate by my interpretation.    COORDINATION OF CARE: 11:22 PM - Discussed plans to order diagnostic imaging of both knees. Pt advised of plan for treatment and pt agrees. Patient's prior labs were reviewed. In October 2016 his renal function was normal. He was  given Toradol 30 mg IM for pain while waiting for the results of his x-rays.  Patient states he has multiple splints for both knees and crutches at home.  12:38 AM - Discussed XR results of mild arthritic changes in the left knee and normal XR of the right knee, as well as plans to discharge. Advised pt to resume wearing braces and applying cold compresses to the knees. Will reefr to Dr Hilda Lias. Pt advised of plan for treatment and pt agrees.   00:40 a.m. patient was given the results of his x-rays. He wants to be referred back to Dr. Hilda Lias, he was given their office number. Patient states ER he has splints and crutches he can wear at home.  1 AM patient is ambulating out of the emergency room in no distress. He stopped me in the hall and was complaining that he thought he needed something stronger than tramadol. I explained to him that he did not have a fracture and for right now tramadol was the strongest medicine I could prescribe him.   Labs Review  Results for orders placed or performed during the hospital encounter of 09/07/15  Comprehensive metabolic panel  Result Value Ref Range   Sodium 133 (L) 135 - 145 mmol/L   Potassium 3.7 3.5 - 5.1 mmol/L   Chloride 96 (L) 101 - 111 mmol/L   CO2 25 22 - 32 mmol/L   Glucose, Bld 346  (H) 65 - 99 mg/dL   BUN 31 (H) 6 - 20 mg/dL   Creatinine, Ser 4.09 0.61 - 1.24 mg/dL   Calcium 9.7 8.9 - 81.1 mg/dL   Total Protein 8.1 6.5 - 8.1 g/dL   Albumin 4.4 3.5 - 5.0 g/dL   AST 17 15 - 41 U/L   ALT 16 (L) 17 - 63 U/L   Alkaline Phosphatase 102 38 - 126 U/L   Total Bilirubin 0.8 0.3 - 1.2 mg/dL   GFR calc non Af Amer >60 >60 mL/min   GFR calc Af Amer >60 >60 mL/min   Anion gap 12 5 - 15  CBC with Differential     Imaging Review Dg Knee Complete 4 Views Left  03/05/2016  CLINICAL DATA:  Twisting injury, missed the bottom step while descending a ladder. EXAM: LEFT KNEE - COMPLETE 4+ VIEW COMPARISON:  None. FINDINGS: Negative for acute fracture dislocation. There is no radiopaque foreign body. 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 Mild arthritic changes are present. There is no bone lesion or bony destruction. IMPRESSION: Negative for acute fracture. Electronically Signed   By: Ellery Plunk M.D.   On: 03/05/2016 00:29   Dg Knee Complete 4 Views Right  03/05/2016  CLINICAL DATA:  Twisting injury. Missed the bottom step while descending a ladder earlier today. EXAM: RIGHT KNEE - COMPLETE 4+ VIEW COMPARISON:  08/15/2013 FINDINGS: There are intact appearances of the total knee arthroplasty. No acute fracture or dislocation is evident. No interval changes evident. IMPRESSION: Negative for acute fracture or dislocation. Electronically Signed   By: Ellery Plunk M.D.   On: 03/05/2016 00:30   I have personally reviewed and evaluated these images and lab results as part of my medical decision-making.    MDM   Final diagnoses:  Knee pain, acute, left  Knee pain, acute, right    Discharge Medication List as of 03/05/2016 12:45 AM    START taking these medications   Details  naproxen (NAPROSYN) 250 MG tablet Take 1 po BID with food prn pain, Print    traMADol (  ULTRAM) 50 MG tablet Take 2 tablets (100 mg total) by mouth every 6 (six) hours as needed., Starting 03/05/2016, Until  Discontinued, Print        Plan discharge  Devoria AlbeIva Breslin Burklow, MD, FACEP   I personally performed the services described in this documentation, which was scribed in my presence. The recorded information has been reviewed and considered.  Devoria AlbeIva Parag Dorton, MD, Concha PyoFACEP   Deanie Jupiter, MD 03/05/16 [phone]

## 2016-03-04 NOTE — ED Notes (Signed)
Bilateral knee pain.  Had a knee replacement in my right knee 6 years ago. Was coming down a step ladder today and missed the bottom step and twisted my left knee.

## 2016-03-04 NOTE — ED Notes (Signed)
Pt reports a hx of knee problems. States today missed a step coming off a ladder & twisted left knee. Pt complaining of bilateral knee pain.

## 2016-03-05 MED ORDER — TRAMADOL HCL 50 MG PO TABS: 100.0000 mg | ORAL_TABLET | Freq: Four times a day (QID) | ORAL | Status: DC | PRN

## 2016-03-05 MED ORDER — NAPROXEN 250 MG PO TABS: ORAL_TABLET | ORAL | Status: DC

## 2016-03-05 NOTE — ED Notes (Signed)
Pt alert & oriented x4, stable gait. Patient given discharge instructions, paperwork & prescription(s). Patient  instructed to stop at the registration desk to finish any additional paperwork. Patient verbalized understanding. Pt left department w/ no further questions. 

## 2016-03-05 NOTE — Discharge Instructions (Signed)
Use your knee braces again for comfort. Use your crutches if needed when walking. Take the tramadol with acetaminophen 650 mg 4 times a day for pain. Take the naproxen for pain. Use ice on the knees for pain and to reduce swelling. Have Dr Hilda LiasKeeling recheck you this week.    Cryotherapy Cryotherapy is when you put ice on your injury. Ice helps lessen pain and puffiness (swelling) after an injury. Ice works the best when you start using it in the first 24 to 48 hours after an injury. HOME CARE  Put a dry or damp towel between the ice pack and your skin.  You may press gently on the ice pack.  Leave the ice on for no more than 10 to 20 minutes at a time.  Check your skin after 5 minutes to make sure your skin is okay.  Rest at least 20 minutes between ice pack uses.  Stop using ice when your skin loses feeling (numbness).  Do not use ice on someone who cannot tell you when it hurts. This includes small children and people with memory problems (dementia). GET HELP RIGHT AWAY IF:  You have white spots on your skin.  Your skin turns blue or pale.  Your skin feels waxy or hard.  Your puffiness gets worse. MAKE SURE YOU:   Understand these instructions.  Will watch your condition.  Will get help right away if you are not doing well or get worse.   This information is not intended to replace advice given to you by your health care provider. Make sure you discuss any questions you have with your health care provider.   Document Released: 04/24/2008 Document Revised: 01/29/2012 Document Reviewed: 06/29/2011 Elsevier Interactive Patient Education 2016 [employer]  Knee Pain Knee pain is a common problem. It can have many causes. The pain often goes away by following your doctor's home care instructions. Treatment for ongoing pain will depend on the cause of your pain. If your knee pain continues, more tests may be needed to diagnose your condition. Tests may include X-rays or other  imaging studies of your knee. HOME CARE  Take medicines only as told by your doctor.  Rest your knee and keep it raised (elevated) while you are resting.  Do not do things that cause pain or make your pain worse.  Avoid activities where both feet leave the ground at the same time, such as running, jumping rope, or doing jumping jacks.  Apply ice to the knee area:  Put ice in a plastic bag.  Place a towel between your skin and the bag.  Leave the ice on for 20 minutes, 2-3 times a day.  Ask your doctor if you should wear an elastic knee support.  Sleep with a pillow under your knee.  Lose weight if you are overweight. Being overweight can make your knee hurt more.  Do not use any tobacco products, including cigarettes, chewing tobacco, or electronic cigarettes. If you need help quitting, ask your doctor. Smoking may slow the healing of any bone and joint problems that you may have. GET HELP IF:  Your knee pain does not stop, it changes, or it gets worse.  You have a fever along with knee pain.  Your knee gives out or locks up.  Your knee becomes more swollen. GET HELP RIGHT AWAY IF:   Your knee feels hot to the touch.  You have chest pain or trouble breathing.   This information is not intended to  replace advice given to you by your health care provider. Make sure you discuss any questions you have with your health care provider.   Document Released: 02/02/2009 Document Revised: 11/27/2014 Document Reviewed: 01/07/2014 Elsevier Interactive Patient Education [employer]

## 2016-03-05 NOTE — ED Notes (Signed)
At discharge pt questioning the prescribed medications in their effectiveness as compared to lortab or percocet. Pt informed that the medications was not a narcotic medication.

## 2017-09-25 ENCOUNTER — Emergency Department (HOSPITAL_COMMUNITY)
Admission: EM | Admit: 2017-09-25 | Discharge: 2017-09-25 | Disposition: A | Discharge status: Home / Self Care | Payer: BLUE CROSS/BLUE SHIELD | Attending: Emergency Medicine | Admitting: Emergency Medicine

## 2017-09-25 ENCOUNTER — Encounter (HOSPITAL_COMMUNITY): Payer: Self-pay | Admitting: *Deleted

## 2017-09-25 ENCOUNTER — Emergency Department (HOSPITAL_COMMUNITY): Payer: BLUE CROSS/BLUE SHIELD

## 2017-09-25 ENCOUNTER — Other Ambulatory Visit: Payer: Self-pay

## 2017-09-25 DIAGNOSIS — R110 Nausea: Secondary | ICD-10-CM | POA: Insufficient documentation

## 2017-09-25 DIAGNOSIS — Z794 Long term (current) use of insulin: Secondary | ICD-10-CM | POA: Diagnosis not present

## 2017-09-25 DIAGNOSIS — E119 Type 2 diabetes mellitus without complications: Secondary | ICD-10-CM | POA: Diagnosis not present

## 2017-09-25 DIAGNOSIS — R1011 Right upper quadrant pain: Secondary | ICD-10-CM | POA: Insufficient documentation

## 2017-09-25 DIAGNOSIS — F17210 Nicotine dependence, cigarettes, uncomplicated: Secondary | ICD-10-CM | POA: Diagnosis not present

## 2017-09-25 DIAGNOSIS — R109 Unspecified abdominal pain: Secondary | ICD-10-CM | POA: Diagnosis present

## 2017-09-25 HISTORY — DX: Acute pancreatitis without necrosis or infection, unspecified: K85.90

## 2017-09-25 LAB — URINALYSIS, ROUTINE W REFLEX MICROSCOPIC
Bilirubin Urine: NEGATIVE
GLUCOSE, UA: 150 mg/dL — AB
Ketones, ur: 20 mg/dL — AB
Leukocytes, UA: NEGATIVE
NITRITE: NEGATIVE
PH: 8 (ref 5.0–8.0)
PROTEIN: 30 mg/dL — AB
Specific Gravity, Urine: 1.027 (ref 1.005–1.030)

## 2017-09-25 LAB — CBC WITH DIFFERENTIAL/PLATELET
Basophils Absolute: 0.1 10*3/uL (ref 0.0–0.1)
Basophils Relative: 1 %
EOS ABS: 0.2 10*3/uL (ref 0.0–0.7)
EOS PCT: 2 %
HCT: 41 % (ref 39.0–52.0)
Hemoglobin: 14.3 g/dL (ref 13.0–17.0)
LYMPHS ABS: 1.5 10*3/uL (ref 0.7–4.0)
Lymphocytes Relative: 19 %
MCH: 32.1 pg (ref 26.0–34.0)
MCHC: 34.9 g/dL (ref 30.0–36.0)
MCV: 91.9 fL (ref 78.0–100.0)
MONO ABS: 0.5 10*3/uL (ref 0.1–1.0)
MONOS PCT: 6 %
Neutro Abs: 5.8 10*3/uL (ref 1.7–7.7)
Neutrophils Relative %: 72 %
PLATELETS: 196 10*3/uL (ref 150–400)
RBC: 4.46 MIL/uL (ref 4.22–5.81)
RDW: 12.4 % (ref 11.5–15.5)
WBC: 8.1 10*3/uL (ref 4.0–10.5)

## 2017-09-25 LAB — COMPREHENSIVE METABOLIC PANEL
ALT: 17 U/L (ref 17–63)
ANION GAP: 12 (ref 5–15)
AST: 21 U/L (ref 15–41)
Albumin: 4 g/dL (ref 3.5–5.0)
Alkaline Phosphatase: 74 U/L (ref 38–126)
BUN: 20 mg/dL (ref 6–20)
CALCIUM: 9.3 mg/dL (ref 8.9–10.3)
CHLORIDE: 97 mmol/L — AB (ref 101–111)
CO2: 23 mmol/L (ref 22–32)
Creatinine, Ser: 0.97 mg/dL (ref 0.61–1.24)
GFR calc non Af Amer: >60 mL/min (ref >60)
Glucose, Bld: 279 mg/dL — ABNORMAL HIGH (ref 65–99)
Potassium: 3.8 mmol/L (ref 3.5–5.1)
SODIUM: 132 mmol/L — AB (ref 135–145)
Total Bilirubin: 1.2 mg/dL (ref 0.3–1.2)
Total Protein: 7.3 g/dL (ref 6.5–8.1)

## 2017-09-25 LAB — LIPASE, BLOOD: LIPASE: 23 U/L (ref 11–51)

## 2017-09-25 MED ORDER — IOPAMIDOL (ISOVUE-300) INJECTION 61%
100.0000 mL | Freq: Once | INTRAVENOUS | Status: AC | PRN
  Administered 2017-09-25: 100 mL via INTRAVENOUS

## 2017-09-25 MED ORDER — HYDROMORPHONE HCL 1 MG/ML IJ SOLN
1.0000 mg | Freq: Once | INTRAMUSCULAR | Status: AC
  Administered 2017-09-25: 1 mg via INTRAVENOUS
  Filled 2017-09-25: qty 1

## 2017-09-25 MED ORDER — SODIUM CHLORIDE 0.9 % IV SOLN
INTRAVENOUS | Status: DC
  Administered 2017-09-25: 12:00:00 via INTRAVENOUS

## 2017-09-25 MED ORDER — DICYCLOMINE HCL 20 MG PO TABS: 20.0000 mg | ORAL_TABLET | Freq: Two times a day (BID) | ORAL | 0 refills | Status: DC

## 2017-09-25 NOTE — ED Provider Notes (Signed)
[facility] EMERGENCY DEPARTMENT Provider Note   CSN: [id number] Arrival date & time: 09/25/17  1147     History   Chief Complaint Chief Complaint  Patient presents with  . Abdominal Pain    HPI [name] is a 56 y.o. male.  HPI  The patient is a 56 year old male, he has a history of diabetes, he has a history of pancreatitis and he has a history of having abdominal pain since March.  The patient reports that he has been through multiple evaluations including an ultrasound which she was told might be abnormal followed by a HIDA scan which was performed yesterday which she was told had an ejection fraction of 46% and did not seem to be out of the range of normal.  He went to bed last night feeling good after eating a roast beef sandwich however he [place] turned and this morning started to have increasing amounts of abdominal pain mostly in the right upper quadrant but also in the right mid abdomen.  He states this is exactly the same as the pain he has had over the last 8 months, he does not know what makes it better or worse but does not seem to be directly linked to eating, his bowel movements have been normal without diarrhea constipation or any difficulty urinating.  He denies fevers or chills.  He has had no medication prior to arrival.  As part of his prior workup he had an in and out catheterization which he states made his pain go away, when the pain came back and he went back to the hospital he asked for a Foley catheter, it did not make his pain go away but he has had the Foley catheter in for over 1 week and was told to keep it until he follows up with a urologist on the 14th of this month.  Review of the medical record at this hospital shows that the patient has not been seen here for abdominal complaints.  He has been getting most of his workup in [place].  Past Medical History:  Diagnosis Date  . Arthritis   . Depression   . Diabetes mellitus   . Pancreatitis    . Renal disorder    kidney stones    There are no active problems to display for this patient.   Past Surgical History:  Procedure Laterality Date  . FINGER AMPUTATION    . HERNIA REPAIR    . KNEE SURGERY    . MENISCUS REPAIR     left and right  . TOTAL KNEE ARTHROPLASTY     Right knee  . VARICOSE VEIN SURGERY         Home Medications    Prior to Admission medications   Medication Sig Start Date End Date Taking? Authorizing Provider  acetaminophen (TYLENOL) 500 MG tablet Take 1,000 mg every 6 (six) hours as needed by mouth for moderate pain.   Yes [provider]  APIDRA 100 UNIT/ML injection 28.75 Units daily by Pump Prime route.  01/22/16  Yes [provider]  atorvastatin (LIPITOR) 40 MG tablet Take 40 mg daily by mouth. 09/19/17  Yes [provider]  diphenhydramine-acetaminophen (TYLENOL PM) 25-500 MG TABS tablet Take 1 tablet at bedtime as needed by mouth (sleep/pain).   Yes [provider]  HYDROcodone-acetaminophen (NORCO/VICODIN) 5-325 MG tablet Take 1 tablet every 6 (six) hours as needed by mouth for pain. for pain 09/21/17  Yes [provider]  losartan (COZAAR)  50 MG tablet Take 50 mg daily by mouth. 09/20/17  Yes [provider]  MAGNESIUM CITRATE PO Take 1 tablet daily by mouth.   Yes [provider]  Multiple Vitamin (MULTIVITAMIN WITH MINERALS) TABS tablet Take 1 tablet by mouth daily.   Yes [provider]  naphazoline (CLEAR EYES) 0.012 % ophthalmic solution Place 1 drop into both eyes daily as needed. For relief    Yes [provider]  pantoprazole (PROTONIX) 40 MG tablet Take 40 mg daily by mouth. 09/19/17  Yes [provider]  polyethylene glycol (MIRALAX / GLYCOLAX) packet Take 17 g daily as needed by mouth for mild constipation.   Yes [provider]  promethazine (PHENERGAN) 12.5 MG tablet Take 12.5 mg every 6 (six) hours as needed by mouth for nausea or  vomiting.   Yes [provider]  tamsulosin (FLOMAX) 0.4 MG CAPS capsule Take 0.4 mg daily by mouth. 09/19/17  Yes [provider]  dicyclomine (BENTYL) 20 MG tablet Take 1 tablet (20 mg total) 2 (two) times daily by mouth. 09/25/17   Eber Hong, MD  Potassium Gluconate 595 MG CAPS Take 1 capsule by mouth daily.    [provider]    Family History No family history on file.  Social History Social History   Tobacco Use  . Smoking status: Current Every Day Smoker    Packs/day: 1.00    Types: Cigarettes  Substance Use Topics  . Alcohol use: No  . Drug use: No     Allergies   Ciprofloxacin-ciprofloxacin hcl; Oxycodone-acetaminophen; and Zolpidem   Review of Systems Review of Systems  All other systems reviewed and are negative.    Physical Exam Updated Vital Signs BP 136/77 (BP Location: Left Arm)   Pulse 70   Temp 99.1 F (37.3 C) (Oral)   Resp 18   Ht 5\' 10"  (1.778 m)   Wt 80.7 kg (178 lb)   SpO2 92%   BMI 25.54 kg/m   Physical Exam  Constitutional: He appears well-developed and well-nourished. No distress.  HENT:  Head: Normocephalic and atraumatic.  Mouth/Throat: Oropharynx is clear and moist. No oropharyngeal exudate.  Eyes: Conjunctivae and EOM are normal. Pupils are equal, round, and reactive to light. Right eye exhibits no discharge. Left eye exhibits no discharge. No scleral icterus.  Neck: Normal range of motion. Neck supple. No JVD present. No thyromegaly present.  Cardiovascular: Normal rate, regular rhythm, normal heart sounds and intact distal pulses. Exam reveals no gallop and no friction rub.  No murmur heard. Pulmonary/Chest: Effort normal and breath sounds normal. No respiratory distress. He has no wheezes. He has no rales.  Abdominal: Soft. Bowel sounds are normal. He exhibits no distension, no fluid wave, no ascites and no mass. There is no hepatosplenomegaly. There is tenderness in the right upper quadrant. There is  positive Murphy's sign. There is no rigidity, no guarding, no CVA tenderness and no tenderness at McBurney's point. No hernia.  Genitourinary:  Genitourinary Comments: No hernias present, Foley catheter present in the urinary bladder  Musculoskeletal: Normal range of motion. He exhibits no edema or tenderness.  Lymphadenopathy:    He has no cervical adenopathy.  Neurological: He is alert. Coordination normal.  Skin: Skin is warm and dry. No rash noted. No erythema.  Psychiatric: He has a normal mood and affect. His behavior is normal.  Nursing note and vitals reviewed.    ED Treatments / Results  Labs (all labs ordered are listed, but only  abnormal results are displayed) Labs Reviewed  COMPREHENSIVE METABOLIC PANEL - Abnormal; Notable for the following components:      Result Value   Sodium 132 (*)    Chloride 97 (*)    Glucose, Bld 279 (*)    All other components within normal limits  URINALYSIS, ROUTINE W REFLEX MICROSCOPIC - Abnormal; Notable for the following components:   Glucose, UA 150 (*)    Hgb urine dipstick MODERATE (*)    Ketones, ur 20 (*)    Protein, ur 30 (*)    Bacteria, UA RARE (*)    Squamous Epithelial / LPF 0-5 (*)    All other components within normal limits  CBC WITH DIFFERENTIAL/PLATELET  LIPASE, BLOOD     Radiology Ct Abdomen Pelvis W Contrast  Result Date: 09/25/2017 CLINICAL DATA:  Abdominal pain with nausea EXAM: CT ABDOMEN AND PELVIS WITH CONTRAST TECHNIQUE: Multidetector CT imaging of the abdomen and pelvis was performed using the standard protocol following bolus administration of intravenous contrast. CONTRAST:  100mL ISOVUE-300 IOPAMIDOL (ISOVUE-300) INJECTION 61% COMPARISON:  September 07, 2015 FINDINGS: Lower chest: There is bibasilar atelectatic change. Lung bases otherwise are clear. Hepatobiliary: No focal liver lesions are apparent. Gallbladder wall is not appreciably thickened. There is no biliary duct dilatation. Pancreas: There is no  appreciable pancreatic mass or inflammatory focus. Spleen: No splenic lesions are evident. Adrenals/Urinary Tract: Adrenals appear normal bilaterally. There are multiple cysts in each kidney. The largest cyst is on the left arising laterally from the lower pole region measuring 2.7 x 2.6 cm. Largest cyst on the right measures 1.5 x 1.4 cm. There is no appreciable hydronephrosis on either side. There is no renal or ureteral calculus on either side. There is a Foley catheter position within the urinary bladder. There remains urine within the bladder with urinary bladder wall thickness felt to be within normal limits. Stomach/Bowel: There are scattered sigmoid diverticula without diverticulitis. There is moderate stool in the colon. There is no bowel wall or mesenteric thickening. No bowel obstruction. No free air or portal venous air. There is mild lipomatous infiltration of the ileocecal valve. Vascular/Lymphatic: There is atherosclerotic calcification in the aorta and iliac arteries. No aneurysm evident. There are scattered foci of atherosclerotic calcification in major mesenteric vessels. Major mesenteric artery vessels appear patent. There is a retroaortic left renal vein, an anatomic variant. There is no appreciable adenopathy in the abdomen or pelvis. There are several small right inguinal lymph nodes, considered nonspecific. Reproductive: There are multiple prostatic calculi. Prostate and seminal vesicles are normal in size and contour. No pelvic masses evident. Other: Appendix appears normal. There is no appreciable abscess or ascites in the abdomen or pelvis. There is fat in the right inguinal ring. There is a small ventral hernia containing only fat. Musculoskeletal: There is degenerative change in the lumbar spine. There are no blastic or lytic bone lesions. There is no intramuscular lesion. There are surgical clips in the right inguinal region. IMPRESSION: 1. Scattered sigmoid diverticula without  diverticulitis. No bowel obstruction. No abscess. Appendix appears normal. 2. No hydronephrosis on either side. No renal or ureteral calculi on either side. Foley catheter within urinary bladder. There is no appreciable urinary bladder wall thickening. There are multiple prostatic calculi evident. 3.  There is aortoiliac atherosclerosis. 4. Small ventral hernia containing only fat. There is fat in the right inguinal ring. Electronically Signed   By: Bretta BangWilliam  Woodruff III M.D.   On: 09/25/2017 13:27    Procedures Procedures (including  critical care time)  Medications Ordered in ED Medications  0.9 %  sodium chloride infusion ( Intravenous New Bag/Given 09/25/17 1210)  HYDROmorphone (DILAUDID) injection 1 mg (1 mg Intravenous Given 09/25/17 1208)  iopamidol (ISOVUE-300) 61 % injection 100 mL (100 mLs Intravenous Contrast Given 09/25/17 1303)     Initial Impression / Assessment and Plan / ED Course  I have reviewed the triage vital signs and the nursing notes.  Pertinent labs & imaging results that were available during my care of the patient were reviewed by me and considered in my medical decision making (see chart for details).     The patient appears uncomfortable and colicky in nature, he cannot tell me whether he has had a CT scan to evaluate his abdominal discomfort, I would consider that this could be related to biliary colic, the patient does not appear jaundiced, he is not diaphoretic, he does not have any urinary symptoms to suggest a renal colic source.  Will obtain labs and obtain a CT scan to further evaluate.  The CT is unremarkable Labs are unremarkable After pain meds, the pt is better. Stable for d;c Given copy of his CT for home. Try Dicyclomine if it comes back.  Final Clinical Impressions(s) / ED Diagnoses   Final diagnoses:  Right upper quadrant abdominal pain    ED Discharge Orders        Ordered    dicyclomine (BENTYL) 20 MG tablet  2 times daily     09/25/17  1411       Eber HongMiller, Saeed Toren, MD 09/25/17 1416

## 2017-09-25 NOTE — Discharge Instructions (Signed)
Dicyclomine every 6 hours as needed if the pain returns See your doctor in the next 2 days for recheck

## 2017-09-25 NOTE — ED Triage Notes (Signed)
Abdominal pain onset this am 

## 2017-09-28 ENCOUNTER — Encounter (HOSPITAL_COMMUNITY): Payer: Self-pay

## 2017-09-28 ENCOUNTER — Emergency Department (HOSPITAL_COMMUNITY)
Admission: EM | Admit: 2017-09-28 | Discharge: 2017-09-28 | Disposition: A | Discharge status: Home / Self Care | Payer: BLUE CROSS/BLUE SHIELD | Attending: Emergency Medicine | Admitting: Emergency Medicine

## 2017-09-28 ENCOUNTER — Other Ambulatory Visit: Payer: Self-pay

## 2017-09-28 DIAGNOSIS — R1011 Right upper quadrant pain: Secondary | ICD-10-CM

## 2017-09-28 DIAGNOSIS — F17210 Nicotine dependence, cigarettes, uncomplicated: Secondary | ICD-10-CM | POA: Insufficient documentation

## 2017-09-28 DIAGNOSIS — Z79899 Other long term (current) drug therapy: Secondary | ICD-10-CM | POA: Diagnosis not present

## 2017-09-28 DIAGNOSIS — R1012 Left upper quadrant pain: Secondary | ICD-10-CM

## 2017-09-28 DIAGNOSIS — G8929 Other chronic pain: Secondary | ICD-10-CM

## 2017-09-28 DIAGNOSIS — E119 Type 2 diabetes mellitus without complications: Secondary | ICD-10-CM | POA: Insufficient documentation

## 2017-09-28 DIAGNOSIS — R1010 Upper abdominal pain, unspecified: Secondary | ICD-10-CM | POA: Insufficient documentation

## 2017-09-28 DIAGNOSIS — Z96651 Presence of right artificial knee joint: Secondary | ICD-10-CM | POA: Diagnosis not present

## 2017-09-28 LAB — CBC WITH DIFFERENTIAL/PLATELET
BASOS ABS: 0 10*3/uL (ref 0.0–0.1)
BASOS PCT: 0 %
EOS ABS: 0.1 10*3/uL (ref 0.0–0.7)
EOS PCT: 1 %
HCT: 44.7 % (ref 39.0–52.0)
Hemoglobin: 16.1 g/dL (ref 13.0–17.0)
Lymphocytes Relative: 21 %
Lymphs Abs: 1.8 10*3/uL (ref 0.7–4.0)
MCH: 32.1 pg (ref 26.0–34.0)
MCHC: 36 g/dL (ref 30.0–36.0)
MCV: 89.2 fL (ref 78.0–100.0)
MONO ABS: 0.9 10*3/uL (ref 0.1–1.0)
MONOS PCT: 10 %
Neutro Abs: 5.6 10*3/uL (ref 1.7–7.7)
Neutrophils Relative %: 68 %
PLATELETS: 272 10*3/uL (ref 150–400)
RBC: 5.01 MIL/uL (ref 4.22–5.81)
RDW: 12.2 % (ref 11.5–15.5)
WBC: 8.4 10*3/uL (ref 4.0–10.5)

## 2017-09-28 LAB — COMPREHENSIVE METABOLIC PANEL
ALBUMIN: 4.5 g/dL (ref 3.5–5.0)
ALT: 17 U/L (ref 17–63)
ANION GAP: 16 — AB (ref 5–15)
AST: 17 U/L (ref 15–41)
Alkaline Phosphatase: 77 U/L (ref 38–126)
BILIRUBIN TOTAL: 1.9 mg/dL — AB (ref 0.3–1.2)
BUN: 20 mg/dL (ref 6–20)
CHLORIDE: 94 mmol/L — AB (ref 101–111)
CO2: 25 mmol/L (ref 22–32)
Calcium: 11.2 mg/dL — ABNORMAL HIGH (ref 8.9–10.3)
Creatinine, Ser: 1.1 mg/dL (ref 0.61–1.24)
GFR calc Af Amer: >60 mL/min (ref >60)
GFR calc non Af Amer: >60 mL/min (ref >60)
GLUCOSE: 217 mg/dL — AB (ref 65–99)
POTASSIUM: 3.6 mmol/L (ref 3.5–5.1)
SODIUM: 135 mmol/L (ref 135–145)
TOTAL PROTEIN: 8.5 g/dL — AB (ref 6.5–8.1)

## 2017-09-28 LAB — LIPASE, BLOOD: Lipase: 25 U/L (ref 11–51)

## 2017-09-28 MED ORDER — SODIUM CHLORIDE 0.9 % IV SOLN: INTRAVENOUS | Status: DC

## 2017-09-28 MED ORDER — ONDANSETRON HCL 4 MG/2ML IJ SOLN
4.0000 mg | Freq: Once | INTRAMUSCULAR | Status: AC
  Administered 2017-09-28: 4 mg via INTRAVENOUS
  Filled 2017-09-28: qty 2

## 2017-09-28 MED ORDER — PROMETHAZINE HCL 25 MG PO TABS
25.0000 mg | ORAL_TABLET | Freq: Four times a day (QID) | ORAL | 1 refills | Status: DC | PRN
Start: 2017-09-28 — End: 2017-10-03

## 2017-09-28 MED ORDER — HYDROMORPHONE HCL 1 MG/ML IJ SOLN
1.0000 mg | Freq: Once | INTRAMUSCULAR | Status: AC
  Administered 2017-09-28: 1 mg via INTRAVENOUS
  Filled 2017-09-28: qty 1

## 2017-09-28 MED ORDER — SODIUM CHLORIDE 0.9 % IV BOLUS (SEPSIS)
1000.0000 mL | Freq: Once | INTRAVENOUS | Status: AC
  Administered 2017-09-28: 1000 mL via INTRAVENOUS

## 2017-09-28 NOTE — Discharge Instructions (Signed)
Continue to follow-up with the GI doctors in [place] and the [facility].  Continue take your Protonix.  Add in the Phenergan as needed.  Today's labs without any significant abnormalities.  Had reviewed the CT scan that she had on October 6.

## 2017-09-28 NOTE — ED Provider Notes (Signed)
[facility] EMERGENCY DEPARTMENT Provider Note   CSN: [id number] Arrival date & time: 09/28/17  [id number]     History   Chief Complaint Chief Complaint  Patient presents with  . Abdominal Pain  . Emesis    HPI Derek DagoKevin C Pelzer is a 56 y.o. male.  Patient with a history of persistent somewhat chronic abdominal pain.  Followed by GI medicine in [place] but is be referred to follow-up at [facility].  Has had upper endoscopy.  Had a CT scan here on October 6 without any acute findings.  Labs at that time were normal.  No evidence of pancreatitis.  Patient has had a history of pancreatitis in the past.  Also has history of kidney stones.  CT scan at that time showed no evidence of that.  Patient states that the pain has been persistent.  He is having difficulty eating and drinking.      Past Medical History:  Diagnosis Date  . Arthritis   . Depression   . Diabetes mellitus   . Pancreatitis   . Renal disorder    kidney stones    There are no active problems to display for this patient.   Past Surgical History:  Procedure Laterality Date  . FINGER AMPUTATION    . HERNIA REPAIR    . KNEE SURGERY    . MENISCUS REPAIR     left and right  . TOTAL KNEE ARTHROPLASTY     Right knee  . VARICOSE VEIN SURGERY         Home Medications    Prior to Admission medications   Medication Sig Start Date End Date Taking? Authorizing Provider  acetaminophen (TYLENOL) 500 MG tablet Take 1,000 mg every 6 (six) hours as needed by mouth for moderate pain.   Yes [provider]  APIDRA 100 UNIT/ML injection 28.75 Units daily by Pump Prime route.  01/22/16  Yes [provider]  atorvastatin (LIPITOR) 40 MG tablet Take 40 mg daily by mouth. 09/19/17  Yes [provider]  dicyclomine (BENTYL) 20 MG tablet Take 1 tablet (20 mg total) 2 (two) times daily by mouth. 09/25/17  Yes Eber HongMiller, Brian, MD  diphenhydramine-acetaminophen (TYLENOL PM) 25-500 MG TABS  tablet Take 1 tablet at bedtime as needed by mouth (sleep/pain).   Yes [provider]  MAGNESIUM CITRATE PO Take 1 tablet daily by mouth.   Yes [provider]  Multiple Vitamin (MULTIVITAMIN WITH MINERALS) TABS tablet Take 1 tablet by mouth daily.   Yes [provider]  pantoprazole (PROTONIX) 40 MG tablet Take 40 mg daily by mouth. 09/19/17  Yes [provider]  promethazine (PHENERGAN) 12.5 MG tablet Take 12.5 mg every 6 (six) hours as needed by mouth for nausea or vomiting.   Yes [provider]  HYDROcodone-acetaminophen (NORCO/VICODIN) 5-325 MG tablet Take 1 tablet every 6 (six) hours as needed by mouth for pain. for pain 09/21/17   [provider]  losartan (COZAAR) 50 MG tablet Take 50 mg daily by mouth. 09/20/17   [provider]  promethazine (PHENERGAN) 25 MG tablet Take 1 tablet (25 mg total) every 6 (six) hours as needed by mouth. 09/28/17   Vanetta MuldersZackowski, Jancy Sprankle, MD    Family History No family history on file.  Social History Social History   Tobacco Use  . Smoking status: Current Every Day Smoker    Packs/day: 1.00    Types: Cigarettes  . Smokeless tobacco: Never Used  Substance Use Topics  .  Alcohol use: No  . Drug use: No     Allergies   Ciprofloxacin-ciprofloxacin hcl; Oxycodone-acetaminophen; and Zolpidem   Review of Systems Review of Systems  Constitutional: Negative for appetite change and fever.  HENT: Negative for congestion.   Eyes: Negative for redness.  Respiratory: Negative for shortness of breath.   Cardiovascular: Negative for chest pain and leg swelling.  Gastrointestinal: Positive for abdominal pain, nausea and vomiting. Negative for diarrhea.  Genitourinary: Negative for dysuria.  Musculoskeletal: Negative for back pain.  Skin: Negative for rash.  Neurological: Negative for headaches.  Hematological: Does not bruise/bleed easily.  Psychiatric/Behavioral: Negative for confusion.      Physical Exam Updated Vital Signs BP 119/86   Pulse 79   Temp 98.7 F (37.1 C) (Oral)   Resp 16   Ht 1.778 m (5\' 10" )   Wt 80.7 kg (178 lb)   SpO2 100%   BMI 25.54 kg/m   Physical Exam  Constitutional: He is oriented to person, place, and time. He appears well-developed and well-nourished. He does not appear ill.  HENT:  Head: Normocephalic and atraumatic.  Mouth/Throat: Oropharynx is clear and moist.  Eyes: EOM are normal. Pupils are equal, round, and reactive to light.  Cardiovascular: Normal rate and regular rhythm.  Pulmonary/Chest: Effort normal and breath sounds normal.  Abdominal: Soft. Normal appearance, normal aorta and bowel sounds are normal. He exhibits no ascites. There is tenderness. There is no guarding.  Neurological: He is alert and oriented to person, place, and time.  Skin: Skin is warm.  Nursing note and vitals reviewed.    ED Treatments / Results  Labs (all labs ordered are listed, but only abnormal results are displayed) Labs Reviewed  COMPREHENSIVE METABOLIC PANEL - Abnormal; Notable for the following components:      Result Value   Chloride 94 (*)    Glucose, Bld 217 (*)    Calcium 11.2 (*)    Total Protein 8.5 (*)    Total Bilirubin 1.9 (*)    Anion gap 16 (*)    All other components within normal limits  CBC WITH DIFFERENTIAL/PLATELET  LIPASE, BLOOD    EKG  EKG Interpretation None       Radiology No results found.  Procedures Procedures (including critical care time)  Medications Ordered in ED Medications  0.9 %  sodium chloride infusion (not administered)  sodium chloride 0.9 % bolus 1,000 mL (1,000 mLs Intravenous New Bag/Given 09/28/17 1257)  HYDROmorphone (DILAUDID) injection 1 mg (1 mg Intravenous Given 09/28/17 1257)  ondansetron (ZOFRAN) injection 4 mg (4 mg Intravenous Given 09/28/17 1257)     Initial Impression / Assessment and Plan / ED Course  I have reviewed the triage vital signs and the nursing  notes.  Pertinent labs & imaging results that were available during my care of the patient were reviewed by me and considered in my medical decision making (see chart for details).     Patient is exam without significant abnormalities.  Patient's labs here without evidence of pancreatitis.  Bilirubin slightly elevated compared to previous labs on October 6.  Lipase normal.  Patient's blood sugar in the 200 range.  No evidence of metabolic acidosis.  No evidence of significant dehydration.  No significant leukocytosis.  Patient's abdominal pain seems to be chronic in nature probably reason why [facility].  Patient did have an upper endoscopy in October patient is on Protonix.  He states that his GI  doctors cannot figure out what is the cause of the pain.  No acute findings here today.  Symptoms seem to be consistent with chronic abdominal pain.  Final Clinical Impressions(s) / ED Diagnoses   Final diagnoses:  Chronic bilateral upper abdominal pain    ED Discharge Orders        Ordered    promethazine (PHENERGAN) 25 MG tablet  Every 6 hours PRN     09/28/17 1336       Vanetta Mulders, MD 09/28/17 1341

## 2017-09-28 NOTE — ED Triage Notes (Signed)
Mid/upper abdominal pain since March on and off per patient. States pain increased about 2 weeks ago with vomiting. States pain in March was pancreatitis and felt the same as today.

## 2017-09-29 ENCOUNTER — Encounter (HOSPITAL_COMMUNITY): Payer: Self-pay

## 2017-09-29 ENCOUNTER — Emergency Department (HOSPITAL_COMMUNITY)
Admission: EM | Admit: 2017-09-29 | Discharge: 2017-09-29 | Disposition: A | Discharge status: Home / Self Care | Payer: BLUE CROSS/BLUE SHIELD | Attending: Emergency Medicine | Admitting: Emergency Medicine

## 2017-09-29 ENCOUNTER — Emergency Department (HOSPITAL_COMMUNITY): Payer: BLUE CROSS/BLUE SHIELD

## 2017-09-29 DIAGNOSIS — K3184 Gastroparesis: Secondary | ICD-10-CM | POA: Insufficient documentation

## 2017-09-29 DIAGNOSIS — F17210 Nicotine dependence, cigarettes, uncomplicated: Secondary | ICD-10-CM | POA: Insufficient documentation

## 2017-09-29 DIAGNOSIS — R110 Nausea: Secondary | ICD-10-CM | POA: Diagnosis not present

## 2017-09-29 DIAGNOSIS — Z79899 Other long term (current) drug therapy: Secondary | ICD-10-CM | POA: Insufficient documentation

## 2017-09-29 DIAGNOSIS — R1013 Epigastric pain: Secondary | ICD-10-CM | POA: Diagnosis present

## 2017-09-29 DIAGNOSIS — E119 Type 2 diabetes mellitus without complications: Secondary | ICD-10-CM | POA: Diagnosis not present

## 2017-09-29 DIAGNOSIS — Z794 Long term (current) use of insulin: Secondary | ICD-10-CM | POA: Diagnosis not present

## 2017-09-29 DIAGNOSIS — Z96651 Presence of right artificial knee joint: Secondary | ICD-10-CM | POA: Diagnosis not present

## 2017-09-29 LAB — COMPREHENSIVE METABOLIC PANEL
ALBUMIN: 3.7 g/dL (ref 3.5–5.0)
ALK PHOS: 60 U/L (ref 38–126)
ALT: 13 U/L — AB (ref 17–63)
AST: 15 U/L (ref 15–41)
Anion gap: 11 (ref 5–15)
BUN: 18 mg/dL (ref 6–20)
CALCIUM: 9.9 mg/dL (ref 8.9–10.3)
CO2: 24 mmol/L (ref 22–32)
CREATININE: 0.86 mg/dL (ref 0.61–1.24)
Chloride: 98 mmol/L — ABNORMAL LOW (ref 101–111)
GFR calc Af Amer: >60 mL/min (ref >60)
GFR calc non Af Amer: >60 mL/min (ref >60)
GLUCOSE: 125 mg/dL — AB (ref 65–99)
Potassium: 3.1 mmol/L — ABNORMAL LOW (ref 3.5–5.1)
SODIUM: 133 mmol/L — AB (ref 135–145)
Total Bilirubin: 1.4 mg/dL — ABNORMAL HIGH (ref 0.3–1.2)
Total Protein: 6.8 g/dL (ref 6.5–8.1)

## 2017-09-29 LAB — CBC WITH DIFFERENTIAL/PLATELET
Basophils Absolute: 0 10*3/uL (ref 0.0–0.1)
Basophils Relative: 1 %
Eosinophils Absolute: 0.1 10*3/uL (ref 0.0–0.7)
Eosinophils Relative: 2 %
HEMATOCRIT: 37.6 % — AB (ref 39.0–52.0)
HEMOGLOBIN: 13.7 g/dL (ref 13.0–17.0)
LYMPHS PCT: 17 %
Lymphs Abs: 1.5 10*3/uL (ref 0.7–4.0)
MCH: 32.2 pg (ref 26.0–34.0)
MCHC: 36.4 g/dL — ABNORMAL HIGH (ref 30.0–36.0)
MCV: 88.5 fL (ref 78.0–100.0)
MONO ABS: 0.6 10*3/uL (ref 0.1–1.0)
Monocytes Relative: 7 %
NEUTROS ABS: 6.2 10*3/uL (ref 1.7–7.7)
NEUTROS PCT: 73 %
Platelets: 227 10*3/uL (ref 150–400)
RBC: 4.25 MIL/uL (ref 4.22–5.81)
RDW: 12.2 % (ref 11.5–15.5)
WBC: 8.4 10*3/uL (ref 4.0–10.5)

## 2017-09-29 LAB — LIPASE, BLOOD: Lipase: 22 U/L (ref 11–51)

## 2017-09-29 MED ORDER — PANTOPRAZOLE SODIUM 40 MG IV SOLR
40.0000 mg | Freq: Once | INTRAVENOUS | Status: AC
  Administered 2017-09-29: 40 mg via INTRAVENOUS
  Filled 2017-09-29: qty 40

## 2017-09-29 MED ORDER — ONDANSETRON HCL 4 MG/2ML IJ SOLN
4.0000 mg | Freq: Once | INTRAMUSCULAR | Status: AC
  Administered 2017-09-29: 4 mg via INTRAVENOUS
  Filled 2017-09-29: qty 2

## 2017-09-29 MED ORDER — MORPHINE SULFATE (PF) 4 MG/ML IV SOLN
4.0000 mg | Freq: Once | INTRAVENOUS | Status: AC
  Administered 2017-09-29: 4 mg via INTRAVENOUS
  Filled 2017-09-29: qty 1

## 2017-09-29 MED ORDER — FENTANYL CITRATE (PF) 100 MCG/2ML IJ SOLN
50.0000 ug | Freq: Once | INTRAMUSCULAR | Status: AC
  Administered 2017-09-29: 50 ug via INTRAVENOUS
  Filled 2017-09-29: qty 2

## 2017-09-29 MED ORDER — SODIUM CHLORIDE 0.9 % IV BOLUS (SEPSIS)
1000.0000 mL | Freq: Once | INTRAVENOUS | Status: AC
  Administered 2017-09-29: 1000 mL via INTRAVENOUS

## 2017-09-29 MED ORDER — METOCLOPRAMIDE HCL 10 MG PO TABS: 10.0000 mg | ORAL_TABLET | Freq: Four times a day (QID) | ORAL | 0 refills | Status: DC

## 2017-09-29 MED ORDER — TRAMADOL HCL 50 MG PO TABS: 50.0000 mg | ORAL_TABLET | Freq: Four times a day (QID) | ORAL | 0 refills | Status: DC | PRN

## 2017-09-29 NOTE — ED Triage Notes (Signed)
Pt c/o severe abd pain and vomiting x 6 days.  LBM was this morning.

## 2017-09-29 NOTE — ED Notes (Signed)
Patient requesting something for pain. EDP made aware. Verbal orders obtained by RN.

## 2017-09-29 NOTE — ED Notes (Signed)
ED Provider at bedside. 

## 2017-09-29 NOTE — ED Provider Notes (Signed)
[facility] EMERGENCY DEPARTMENT Provider Note   CSN: [id number] Arrival date & time: 09/29/17  [id number]     History   Chief Complaint Chief Complaint  Patient presents with  . Abdominal Pain    HPI Derek DagoKevin C Dicioccio is a 56 y.o. male.  Patient reports epigastric pain since March worse the past several days.  Is been diabetic for 13 years.  Decreased oral intake for the past 6 days.  He has nausea but no vomiting, fever, sweats, chills, chest pain, jaundicing.  CT abdomen pelvis on 09/25/17 was read as normal.  Past medical history includes diabetes, depression, pancreatitis.  Last bowel movement today.      Past Medical History:  Diagnosis Date  . Arthritis   . Depression   . Diabetes mellitus   . Pancreatitis   . Renal disorder    kidney stones    There are no active problems to display for this patient.   Past Surgical History:  Procedure Laterality Date  . FINGER AMPUTATION    . HERNIA REPAIR    . KNEE SURGERY    . MENISCUS REPAIR     left and right  . TOTAL KNEE ARTHROPLASTY     Right knee  . VARICOSE VEIN SURGERY         Home Medications    Prior to Admission medications   Medication Sig Start Date End Date Taking? Authorizing Provider  APIDRA 100 UNIT/ML injection 28.75 Units daily by Pump Prime route.  01/22/16  Yes [provider]  dicyclomine (BENTYL) 20 MG tablet Take 1 tablet (20 mg total) 2 (two) times daily by mouth. 09/25/17  Yes Eber HongMiller, Maricarmen Braziel, MD  esomeprazole (NEXIUM 24HR) 20 MG capsule Take 20 mg daily at 12 noon by mouth.   Yes [provider]  pantoprazole (PROTONIX) 40 MG tablet Take 40 mg daily by mouth. 09/19/17  Yes [provider]  polyethylene glycol powder (GLYCOLAX/MIRALAX) powder Take 17 g once by mouth.   Yes [provider]  promethazine (PHENERGAN) 12.5 MG tablet Take 12.5 mg every 6 (six) hours as needed by mouth for nausea or vomiting.   Yes [provider]  metoCLOPramide (REGLAN) 10  MG tablet Take 1 tablet (10 mg total) every 6 (six) hours by mouth. 09/29/17   Donnetta Hutchingook, Lexany Belknap, MD  promethazine (PHENERGAN) 25 MG tablet Take 1 tablet (25 mg total) every 6 (six) hours as needed by mouth. Patient not taking: Reported on 09/29/2017 09/28/17   Vanetta MuldersZackowski, Scott, MD  traMADol (ULTRAM) 50 MG tablet Take 1 tablet (50 mg total) every 6 (six) hours as needed by mouth. 09/29/17   Donnetta Hutchingook, Nova Evett, MD    Family History No family history on file.  Social History Social History   Tobacco Use  . Smoking status: Current Every Day Smoker    Packs/day: 1.00    Types: Cigarettes  . Smokeless tobacco: Never Used  Substance Use Topics  . Alcohol use: No  . Drug use: No     Allergies   Ciprofloxacin-ciprofloxacin hcl; Oxycodone-acetaminophen; and Zolpidem   Review of Systems Review of Systems  All other systems reviewed and are negative.    Physical Exam Updated Vital Signs BP (!) 154/90   Pulse 66   Temp 98.9 F (37.2 C) (Oral)   Resp 16   Ht 5\' 10"  (1.778 m)   Wt 80.7 kg (178 lb)   SpO2 98%   BMI 25.54 kg/m   Physical Exam  Constitutional: He is oriented to  person, place, and time. He appears well-developed and well-nourished.  HENT:  Head: Normocephalic and atraumatic.  Eyes: Conjunctivae are normal.  Neck: Neck supple.  Cardiovascular: Normal rate and regular rhythm.  Pulmonary/Chest: Effort normal and breath sounds normal.  Abdominal: Soft. Bowel sounds are normal.  Minimal epigastric tenderness.  Musculoskeletal: Normal range of motion.  Neurological: He is alert and oriented to person, place, and time.  Skin: Skin is warm and dry.  Psychiatric: He has a normal mood and affect. His behavior is normal.  Nursing note and vitals reviewed.    ED Treatments / Results  Labs (all labs ordered are listed, but only abnormal results are displayed) Labs Reviewed  CBC WITH DIFFERENTIAL/PLATELET - Abnormal; Notable for the following components:      Result Value    HCT 37.6 (*)    MCHC 36.4 (*)    All other components within normal limits  COMPREHENSIVE METABOLIC PANEL - Abnormal; Notable for the following components:   Sodium 133 (*)    Potassium 3.1 (*)    Chloride 98 (*)    Glucose, Bld 125 (*)    ALT 13 (*)    Total Bilirubin 1.4 (*)    All other components within normal limits  LIPASE, BLOOD    EKG  EKG Interpretation None       Radiology [place] Abdomen Limited  Result Date: 09/29/2017 CLINICAL DATA:  Epigastric pain. EXAM: ULTRASOUND ABDOMEN LIMITED RIGHT UPPER QUADRANT COMPARISON:  CT, 09/25/2017 FINDINGS: Gallbladder: No gallstones or wall thickening visualized. No sonographic Murphy sign noted by sonographer. Common bile duct: Diameter: 3.6 mm Liver: No focal lesion identified. Within normal limits in parenchymal echogenicity. Portal vein is patent on color Doppler imaging with normal direction of blood flow towards the liver. IMPRESSION: Normal right upper quadrant ultrasound. Electronically Signed   By: Amie Portlandavid  Ormond M.D.   On: 09/29/2017 12:28    Procedures Procedures (including critical care time)  Medications Ordered in ED Medications  sodium chloride 0.9 % bolus 1,000 mL (0 mLs Intravenous Stopped 09/29/17 0951)  ondansetron (ZOFRAN) injection 4 mg (4 mg Intravenous Given 09/29/17 0827)  sodium chloride 0.9 % bolus 1,000 mL (0 mLs Intravenous Stopped 09/29/17 0951)  pantoprazole (PROTONIX) injection 40 mg (40 mg Intravenous Given 09/29/17 0827)  fentaNYL (SUBLIMAZE) injection 50 mcg (50 mcg Intravenous Given 09/29/17 1000)  morphine 4 MG/ML injection 4 mg (4 mg Intravenous Given 09/29/17 1131)     Initial Impression / Assessment and Plan / ED Course  I have reviewed the triage vital signs and the nursing notes.  Pertinent labs & imaging results that were available during my care of the patient were reviewed by me and considered in my medical decision making (see chart for details).     Patient presents with recurrent  epigastric pain.  Recent CT of abdomen/pelvis was normal.  Ultrasound GB today negative.  His white count, liver functions and lipase were normal.  T Bili slightly elevated. He feels better after IVF, pain meds, protonix.  Will refer to GI.  Discharge meds Reglan 10 mg, Tramadol 50 mg.  Final Clinical Impressions(s) / ED Diagnoses   Final diagnoses:  Gastroparesis    ED Discharge Orders        Ordered    metoCLOPramide (REGLAN) 10 MG tablet  Every 6 hours     09/29/17 1519    traMADol (ULTRAM) 50 MG tablet  Every 6 hours PRN     09/29/17 1519  Donnetta Hutching, MD 09/29/17 [phone]

## 2017-09-29 NOTE — Discharge Instructions (Signed)
Follow-up with gastroenterologist.  You may need a scoping procedure to evaluate your stomach.  Phone number given.  Medication for pain and to help your stomach to empty.  Continue with your other medications.

## 2017-09-30 ENCOUNTER — Emergency Department (HOSPITAL_COMMUNITY)
Admission: EM | Admit: 2017-09-30 | Discharge: 2017-09-30 | Discharge status: Against Medical Advice | Payer: BLUE CROSS/BLUE SHIELD

## 2017-09-30 NOTE — ED Notes (Signed)
No answer when called for triage 

## 2017-09-30 NOTE — ED Notes (Signed)
No answer in lobby when called.

## 2017-10-03 ENCOUNTER — Ambulatory Visit (INDEPENDENT_AMBULATORY_CARE_PROVIDER_SITE_OTHER): Payer: BLUE CROSS/BLUE SHIELD | Admitting: Internal Medicine

## 2017-10-03 ENCOUNTER — Encounter (INDEPENDENT_AMBULATORY_CARE_PROVIDER_SITE_OTHER): Payer: Self-pay | Admitting: Internal Medicine

## 2017-10-03 VITALS — BP 160/68 | HR 82 | Temp 98.4°F | Ht 70.0 in | Wt 178.4 lb

## 2017-10-03 DIAGNOSIS — R1010 Upper abdominal pain, unspecified: Secondary | ICD-10-CM

## 2017-10-03 NOTE — Progress Notes (Signed)
Subjective:    Patient ID: [name], male    DOB: February 12, 1961, 56 y.o.   MRN: [id number] PCP Moshe Cipro. HPI Referred by [facility] ED after recent visit to the ED for abdominal pain. Has been seen in the ED x 3 for same. 09/25/2017 CT scan revealed  IMPRESSION: 1. Scattered sigmoid diverticula without diverticulitis. No bowel obstruction. No abscess. Appendix appears normal.  2. No hydronephrosis on either side. No renal or ureteral calculi on either side. Foley catheter within urinary bladder. There is no appreciable urinary bladder wall thickening. There are multiple prostatic calculi evident. 3.  There is aortoiliac atherosclerosis. 4. Small ventral hernia containing only fat. There is fat in the right inguinal ring. [place] on 09/29/2017 revealed normal RUQ [place].   Hx of diabetes x 13 yrs. He has nausea but no vomiting, fever, sweats. He points to his epigastric pain. He says the pain was excruciating. Pain would last 3-6 hours.  Was not related to eating.  Has not had any pain since Sunday evening. He has a BM once every 4 days. No melena or BRRB.  The pain does not occur after eating.  When the pain occurs, he has no appetite. States he was admitted to [facility] in March with pancreatitis.  States he has had a HIDA scan 1 week ago and his GB was functioning at 46%.  Upper GI was normal.   He is a patient of Dr. Posey Pronto. Just saw last week. (Dr. Posey Pronto ordered ordered GI tests) EGD in March was normal. Biopsies were normal          Hx of diabetes, depression and pancreatitis.   Hepatic Function Latest Ref Rng & Units 09/29/2017 09/28/2017 09/25/2017  Total Protein 6.5 - 8.1 g/dL 6.8 8.5(H) 7.3  Albumin 3.5 - 5.0 g/dL 3.7 4.5 4.0  AST 15 - 41 U/L '15 17 21  ' ALT 17 - 63 U/L 13(L) 17 17  Alk Phosphatase 38 - 126 U/L 60 77 74  Total Bilirubin 0.3 - 1.2 mg/dL 1.4(H) 1.9(H) 1.2  Bilirubin, Direct 0.0 - 0.3 mg/dL - - -   CBC    Component Value Date/Time   WBC 8.4 09/29/2017  0828   RBC 4.25 09/29/2017 0828   HGB 13.7 09/29/2017 0828   HCT 37.6 (L) 09/29/2017 0828   PLT 227 09/29/2017 0828   MCV 88.5 09/29/2017 0828   MCH 32.2 09/29/2017 0828   MCHC 36.4 (H) 09/29/2017 0828   RDW 12.2 09/29/2017 0828   LYMPHSABS 1.5 09/29/2017 0828   MONOABS 0.6 09/29/2017 0828   EOSABS 0.1 09/29/2017 0828   BASOSABS 0.0 09/29/2017 0828   09/29/2017 Lipase 22.   Review of Systems Past Medical History:  Diagnosis Date  . Arthritis   . Depression   . Diabetes mellitus   . Pancreatitis   . Renal disorder    kidney stones    Past Surgical History:  Procedure Laterality Date  . FINGER AMPUTATION    . HERNIA REPAIR    . KNEE SURGERY    . MENISCUS REPAIR     left and right  . TOTAL KNEE ARTHROPLASTY     Right knee  . VARICOSE VEIN SURGERY      Allergies  Allergen Reactions  . Ciprofloxacin-Ciprofloxacin Hcl Nausea And Vomiting  . Oxycodone-Acetaminophen Itching  . Zolpidem Other (See Comments)    Sleep walking    Current Outpatient Medications on File Prior to Visit  Medication Sig Dispense Refill  . APIDRA 100  UNIT/ML injection 28.75 Units daily by Pump Prime route.   12  . ciprofloxacin (CIPRO) 250 MG tablet Take 250 mg 2 (two) times daily by mouth.    . dicyclomine (BENTYL) 20 MG tablet Take 1 tablet (20 mg total) 2 (two) times daily by mouth. 20 tablet 0  . metoCLOPramide (REGLAN) 10 MG tablet Take 1 tablet (10 mg total) every 6 (six) hours by mouth. 40 tablet 0  . pantoprazole (PROTONIX) 40 MG tablet Take 40 mg daily by mouth.  4  . polyethylene glycol powder (GLYCOLAX/MIRALAX) powder Take 17 g once by mouth.    . promethazine (PHENERGAN) 12.5 MG tablet Take 12.5 mg every 6 (six) hours as needed by mouth for nausea or vomiting.    . traMADol (ULTRAM) 50 MG tablet Take 1 tablet (50 mg total) every 6 (six) hours as needed by mouth. 20 tablet 0   No current facility-administered medications on file prior to visit.         Objective:   Physical  Exam Blood pressure (!) 160/68, pulse 82, temperature 98.4 F (36.9 C), height '5\' 10"'  (1.778 m), weight 178 lb 6.4 oz (80.9 kg). Alert and oriented. Skin warm and dry. Oral mucosa is moist.   . Sclera anicteric, conjunctivae is pink. Thyroid not enlarged. No cervical lymphadenopathy. Lungs clear. Heart regular rate and rhythm.  Abdomen is soft. Bowel sounds are positive. No hepatomegaly. No abdominal masses felt. No tenderness.  No edema to lower extremities.          Assessment & Plan:  Epigastric pain. ? Etiology. All test so far have been negative.  Will get a Emptying study

## 2017-10-03 NOTE — Patient Instructions (Addendum)
Emptying study.  

## 2017-10-04 ENCOUNTER — Encounter (HOSPITAL_COMMUNITY): Payer: Self-pay | Admitting: *Deleted

## 2017-10-04 ENCOUNTER — Other Ambulatory Visit: Payer: Self-pay

## 2017-10-04 ENCOUNTER — Emergency Department (HOSPITAL_COMMUNITY)
Admission: EM | Admit: 2017-10-04 | Discharge: 2017-10-04 | Disposition: A | Discharge status: Home / Self Care | Payer: BLUE CROSS/BLUE SHIELD | Attending: Emergency Medicine | Admitting: Emergency Medicine

## 2017-10-04 ENCOUNTER — Telehealth (INDEPENDENT_AMBULATORY_CARE_PROVIDER_SITE_OTHER): Payer: Self-pay | Admitting: Internal Medicine

## 2017-10-04 DIAGNOSIS — Z79899 Other long term (current) drug therapy: Secondary | ICD-10-CM | POA: Diagnosis not present

## 2017-10-04 DIAGNOSIS — R112 Nausea with vomiting, unspecified: Secondary | ICD-10-CM | POA: Diagnosis not present

## 2017-10-04 DIAGNOSIS — F17210 Nicotine dependence, cigarettes, uncomplicated: Secondary | ICD-10-CM | POA: Insufficient documentation

## 2017-10-04 DIAGNOSIS — E119 Type 2 diabetes mellitus without complications: Secondary | ICD-10-CM | POA: Diagnosis not present

## 2017-10-04 DIAGNOSIS — R110 Nausea: Secondary | ICD-10-CM

## 2017-10-04 DIAGNOSIS — R1013 Epigastric pain: Secondary | ICD-10-CM | POA: Insufficient documentation

## 2017-10-04 LAB — COMPREHENSIVE METABOLIC PANEL
ALT: 16 U/L — ABNORMAL LOW (ref 17–63)
ANION GAP: 12 (ref 5–15)
AST: 16 U/L (ref 15–41)
Albumin: 4 g/dL (ref 3.5–5.0)
Alkaline Phosphatase: 72 U/L (ref 38–126)
BUN: 18 mg/dL (ref 6–20)
CO2: 25 mmol/L (ref 22–32)
Calcium: 11.2 mg/dL — ABNORMAL HIGH (ref 8.9–10.3)
Chloride: 98 mmol/L — ABNORMAL LOW (ref 101–111)
Creatinine, Ser: 0.84 mg/dL (ref 0.61–1.24)
GFR calc non Af Amer: >60 mL/min (ref >60)
Glucose, Bld: 196 mg/dL — ABNORMAL HIGH (ref 65–99)
Potassium: 4.1 mmol/L (ref 3.5–5.1)
SODIUM: 135 mmol/L (ref 135–145)
Total Bilirubin: 0.7 mg/dL (ref 0.3–1.2)
Total Protein: 7.8 g/dL (ref 6.5–8.1)

## 2017-10-04 LAB — CBC WITH DIFFERENTIAL/PLATELET
BASOS ABS: 0.1 10*3/uL (ref 0.0–0.1)
BASOS PCT: 1 %
Eosinophils Absolute: 0.1 10*3/uL (ref 0.0–0.7)
Eosinophils Relative: 2 %
HEMATOCRIT: 41.1 % (ref 39.0–52.0)
HEMOGLOBIN: 14.6 g/dL (ref 13.0–17.0)
LYMPHS PCT: 24 %
Lymphs Abs: 2.1 10*3/uL (ref 0.7–4.0)
MCH: 32.2 pg (ref 26.0–34.0)
MCHC: 35.5 g/dL (ref 30.0–36.0)
MCV: 90.7 fL (ref 78.0–100.0)
Monocytes Absolute: 0.5 10*3/uL (ref 0.1–1.0)
Monocytes Relative: 6 %
NEUTROS ABS: 5.9 10*3/uL (ref 1.7–7.7)
NEUTROS PCT: 67 %
Platelets: 270 10*3/uL (ref 150–400)
RBC: 4.53 MIL/uL (ref 4.22–5.81)
RDW: 12.4 % (ref 11.5–15.5)
WBC: 8.7 10*3/uL (ref 4.0–10.5)

## 2017-10-04 LAB — LIPASE, BLOOD: LIPASE: 21 U/L (ref 11–51)

## 2017-10-04 LAB — ETHANOL

## 2017-10-04 MED ORDER — HYDROMORPHONE HCL 1 MG/ML IJ SOLN
1.0000 mg | Freq: Once | INTRAMUSCULAR | Status: AC
  Administered 2017-10-04: 1 mg via INTRAVENOUS
  Filled 2017-10-04: qty 1

## 2017-10-04 MED ORDER — HYDROCODONE-ACETAMINOPHEN 5-325 MG PO TABS: 1.0000 | ORAL_TABLET | Freq: Four times a day (QID) | ORAL | 0 refills | Status: DC | PRN

## 2017-10-04 MED ORDER — GI COCKTAIL ~~LOC~~
30.0000 mL | Freq: Once | ORAL | Status: AC
  Administered 2017-10-04: 30 mL via ORAL
  Filled 2017-10-04: qty 30

## 2017-10-04 MED ORDER — SUCRALFATE 1 GM/10ML PO SUSP
1.0000 g | Freq: Three times a day (TID) | ORAL | Status: DC
  Administered 2017-10-04: 1 g via ORAL
  Filled 2017-10-04: qty 10

## 2017-10-04 MED ORDER — SODIUM CHLORIDE 0.9 % IV BOLUS (SEPSIS)
1000.0000 mL | Freq: Once | INTRAVENOUS | Status: AC
  Administered 2017-10-04: 1000 mL via INTRAVENOUS

## 2017-10-04 MED ORDER — FAMOTIDINE IN NACL 20-0.9 MG/50ML-% IV SOLN
20.0000 mg | Freq: Once | INTRAVENOUS | Status: AC
  Administered 2017-10-04: 20 mg via INTRAVENOUS
  Filled 2017-10-04: qty 50

## 2017-10-04 MED ORDER — FENTANYL CITRATE (PF) 100 MCG/2ML IJ SOLN
50.0000 ug | Freq: Once | INTRAMUSCULAR | Status: AC
  Administered 2017-10-04: 50 ug via INTRAVENOUS
  Filled 2017-10-04: qty 2

## 2017-10-04 MED ORDER — KETOROLAC TROMETHAMINE 30 MG/ML IJ SOLN
15.0000 mg | Freq: Once | INTRAMUSCULAR | Status: AC
  Administered 2017-10-04: 15 mg via INTRAVENOUS
  Filled 2017-10-04: qty 1

## 2017-10-04 NOTE — Telephone Encounter (Signed)
Patient called, lmoam stating that he is having severe stomach pain.  He would like a call back.  [phone]

## 2017-10-04 NOTE — ED Provider Notes (Signed)
[facility] EMERGENCY DEPARTMENT Provider Note   CSN: [id number] Arrival date & time: 10/04/17  1539     History   Chief Complaint Chief Complaint  Patient presents with  . Abdominal Pain    HPI MICKIE KOZIKOWSKI is a 56 y.o. male.  HPI  Concern of abdominal pain, nausea, vomiting. Pain is focally in the epigastrium, nonradiating, with associated anorexia. Patient notes multiple prior similar incidents. This is a " typical episode " though the severity may be more than usual. Patient experienced similar symptoms 1 week ago, was seen here, discharged, and today developed recurrence. Patient attempted to follow-up with GI today, but was referred here for evaluation. She denies recent medication changes, but states that he was unable to take medication today secondary to nausea and vomiting.  Past Medical History:  Diagnosis Date  . Arthritis   . Depression   . Diabetes mellitus   . Pancreatitis   . Renal disorder    kidney stones    There are no active problems to display for this patient.   Past Surgical History:  Procedure Laterality Date  . FINGER AMPUTATION    . HERNIA REPAIR    . KNEE SURGERY    . MENISCUS REPAIR     left and right  . TOTAL KNEE ARTHROPLASTY     Right knee  . VARICOSE VEIN SURGERY         Home Medications    Prior to Admission medications   Medication Sig Start Date End Date Taking? Authorizing Provider  APIDRA 100 UNIT/ML injection 28.75 Units daily by Pump Prime route.  01/22/16   [provider]  ciprofloxacin (CIPRO) 250 MG tablet Take 250 mg 2 (two) times daily by mouth.    [provider]  dicyclomine (BENTYL) 20 MG tablet Take 1 tablet (20 mg total) 2 (two) times daily by mouth. 09/25/17   Eber Hong, MD  metoCLOPramide (REGLAN) 10 MG tablet Take 1 tablet (10 mg total) every 6 (six) hours by mouth. 09/29/17   Donnetta Hutching, MD  pantoprazole (PROTONIX) 40 MG tablet Take 40 mg daily by mouth. 09/19/17   [provider]  polyethylene glycol powder (GLYCOLAX/MIRALAX) powder Take 17 g once by mouth.    [provider]  promethazine (PHENERGAN) 12.5 MG tablet Take 12.5 mg every 6 (six) hours as needed by mouth for nausea or vomiting.    [provider]  traMADol (ULTRAM) 50 MG tablet Take 1 tablet (50 mg total) every 6 (six) hours as needed by mouth. 09/29/17   Donnetta Hutching, MD    Family History No family history on file.  Social History Social History   Tobacco Use  . Smoking status: Current Every Day Smoker    Packs/day: 1.00    Types: Cigarettes  . Smokeless tobacco: Never Used  Substance Use Topics  . Alcohol use: No  . Drug use: No     Allergies   Ciprofloxacin-ciprofloxacin hcl; Oxycodone-acetaminophen; and Zolpidem   Review of Systems Review of Systems  Constitutional:       Per HPI, otherwise negative  HENT:       Per HPI, otherwise negative  Respiratory:       Per HPI, otherwise negative  Cardiovascular:       Per HPI, otherwise negative  Gastrointestinal: Positive for abdominal pain, nausea and vomiting.  Endocrine:       Negative aside from HPI  Genitourinary:       Neg aside from HPI  Musculoskeletal:       Per HPI, otherwise negative  Skin: Negative.   Neurological: Negative for syncope.     Physical Exam Updated Vital Signs BP (!) 153/118   Pulse 93   Temp 99.3 F (37.4 C) (Temporal)   Resp 16   Ht 5\' 10"  (1.778 m)   Wt 80.7 kg (178 lb)   SpO2 93%   BMI 25.54 kg/m   Physical Exam  Constitutional: He is oriented to person, place, and time. He appears well-developed. No distress.  HENT:  Head: Normocephalic and atraumatic.  Eyes: Conjunctivae and EOM are normal.  Cardiovascular: Normal rate and regular rhythm.  Pulmonary/Chest: Effort normal. No stridor. No respiratory distress.  Abdominal: He exhibits no distension. There is tenderness in the epigastric area.  Musculoskeletal: He exhibits no edema.  Neurological: He is  alert and oriented to person, place, and time.  Skin: Skin is warm and dry.  Psychiatric: He has a normal mood and affect.  Nursing note and vitals reviewed.    ED Treatments / Results  Labs (all labs ordered are listed, but only abnormal results are displayed) Labs Reviewed  COMPREHENSIVE METABOLIC PANEL - Abnormal; Notable for the following components:      Result Value   Chloride 98 (*)    Glucose, Bld 196 (*)    Calcium 11.2 (*)    ALT 16 (*)    All other components within normal limits  ETHANOL  LIPASE, BLOOD  CBC WITH DIFFERENTIAL/PLATELET    Procedures Procedures (including critical care time)  Medications Ordered in ED Medications  HYDROmorphone (DILAUDID) injection 1 mg (not administered)  sodium chloride 0.9 % bolus 1,000 mL (not administered)  sucralfate (CARAFATE) 1 GM/10ML suspension 1 g (not administered)  sodium chloride 0.9 % bolus 1,000 mL (0 mLs Intravenous Stopped 10/04/17 1845)  ketorolac (TORADOL) 30 MG/ML injection 15 mg (15 mg Intravenous Given 10/04/17 1729)  gi cocktail (Maalox,Lidocaine,Donnatal) (30 mLs Oral Given 10/04/17 1730)  famotidine (PEPCID) IVPB 20 mg premix (0 mg Intravenous Stopped 10/04/17 1810)  fentaNYL (SUBLIMAZE) injection 50 mcg (50 mcg Intravenous Given 10/04/17 1729)  HYDROmorphone (DILAUDID) injection 1 mg (1 mg Intravenous Given 10/04/17 1821)     Initial Impression / Assessment and Plan / ED Course  I have reviewed the triage vital signs and the nursing notes.  Pertinent labs & imaging results that were available during my care of the patient were reviewed by me and considered in my medical decision making (see chart for details).  Chart reviewed including documentation from last week during a similar episode.   7:47 PM Patient appears substantially more calm. He continues to complain of some pain, but states that he is improving. Chart review now more thorough, particularly notable for imaging studies within the past  week, ultrasound and CT scan as below. IMPRESSION: 1. Scattered sigmoid diverticula without diverticulitis. No bowel obstruction. No abscess. Appendix appears normal.   2. No hydronephrosis on either side. No renal or ureteral calculi on either side. Foley catheter within urinary bladder. There is no appreciable urinary bladder wall thickening. There are multiple prostatic calculi evident.   3.  There is aortoiliac atherosclerosis.   4. Small ventral hernia containing only fat. There is fat in the right inguinal ring.     Electronically Signed   By: Bretta BangWilliam  Woodruff III M.D.   On: 09/25/2017 13:27 IMPRESSION: Normal right upper quadrant ultrasound.     Electronically Signed   By: Amie Portlandavid  Ormond M.D.   On:  09/29/2017 12:28    Update: Patient appears calm. We again discussed all findings including reassuring labs, reassuring CT scan, ultrasound from last week, and his improvement. He remains hemodynamically stable.  With otherwise soft, non-peritoneal abdomen, little suspicion for acute abdominal processes per Some suspicion for functional abdominal pain versus gastroesophageal etiology Patient's improvement here is reassuring. He was discharged in stable condition with GI follow-up as previously directed.  Final Clinical Impressions(s) / ED Diagnoses  Normal pain Nausea and vomit   Gerhard MunchLockwood, Keven Osborn, MD 10/04/17 2024

## 2017-10-04 NOTE — Telephone Encounter (Signed)
He says his pain is severe. I advised him to go to the ED

## 2017-10-04 NOTE — Discharge Instructions (Signed)
As discussed, your evaluation today has been largely reassuring.  But, it is important that you monitor your condition carefully, and do not hesitate to return to the ED if you develop new, or concerning changes in your condition. ? ?Otherwise, please follow-up with your physician for appropriate ongoing care. ? ?

## 2017-10-04 NOTE — ED Triage Notes (Signed)
Pt c/o severe epigastric pain and nausea that started around 0430 this morning. Denies vomiting, diarrhea. Pt reports this is his fourth time here in the ED this week.

## 2017-10-06 ENCOUNTER — Encounter (HOSPITAL_COMMUNITY): Payer: Self-pay | Admitting: Emergency Medicine

## 2017-10-06 ENCOUNTER — Emergency Department (HOSPITAL_COMMUNITY)
Admission: EM | Admit: 2017-10-06 | Discharge: 2017-10-06 | Disposition: A | Discharge status: Home / Self Care | Payer: BLUE CROSS/BLUE SHIELD | Attending: Emergency Medicine | Admitting: Emergency Medicine

## 2017-10-06 DIAGNOSIS — Z7984 Long term (current) use of oral hypoglycemic drugs: Secondary | ICD-10-CM | POA: Diagnosis not present

## 2017-10-06 DIAGNOSIS — Z79891 Long term (current) use of opiate analgesic: Secondary | ICD-10-CM | POA: Insufficient documentation

## 2017-10-06 DIAGNOSIS — R1010 Upper abdominal pain, unspecified: Secondary | ICD-10-CM

## 2017-10-06 DIAGNOSIS — F17210 Nicotine dependence, cigarettes, uncomplicated: Secondary | ICD-10-CM | POA: Insufficient documentation

## 2017-10-06 DIAGNOSIS — Z79899 Other long term (current) drug therapy: Secondary | ICD-10-CM | POA: Diagnosis not present

## 2017-10-06 DIAGNOSIS — R1013 Epigastric pain: Secondary | ICD-10-CM | POA: Insufficient documentation

## 2017-10-06 DIAGNOSIS — E119 Type 2 diabetes mellitus without complications: Secondary | ICD-10-CM | POA: Diagnosis not present

## 2017-10-06 LAB — COMPREHENSIVE METABOLIC PANEL
ALT: 13 U/L — ABNORMAL LOW (ref 17–63)
ANION GAP: 7 (ref 5–15)
AST: 14 U/L — AB (ref 15–41)
Albumin: 3.8 g/dL (ref 3.5–5.0)
Alkaline Phosphatase: 66 U/L (ref 38–126)
BUN: 15 mg/dL (ref 6–20)
CHLORIDE: 100 mmol/L — AB (ref 101–111)
CO2: 29 mmol/L (ref 22–32)
Calcium: 9.9 mg/dL (ref 8.9–10.3)
Creatinine, Ser: 0.79 mg/dL (ref 0.61–1.24)
Glucose, Bld: 68 mg/dL (ref 65–99)
POTASSIUM: 4.1 mmol/L (ref 3.5–5.1)
Sodium: 136 mmol/L (ref 135–145)
Total Bilirubin: 0.3 mg/dL (ref 0.3–1.2)
Total Protein: 7.2 g/dL (ref 6.5–8.1)

## 2017-10-06 LAB — CBC WITH DIFFERENTIAL/PLATELET
BASOS ABS: 0.1 10*3/uL (ref 0.0–0.1)
Basophils Relative: 1 %
EOS PCT: 3 %
Eosinophils Absolute: 0.2 10*3/uL (ref 0.0–0.7)
HCT: 38.9 % — ABNORMAL LOW (ref 39.0–52.0)
Hemoglobin: 13.3 g/dL (ref 13.0–17.0)
LYMPHS PCT: 20 %
Lymphs Abs: 1.5 10*3/uL (ref 0.7–4.0)
MCH: 31.1 pg (ref 26.0–34.0)
MCHC: 34.2 g/dL (ref 30.0–36.0)
MCV: 90.9 fL (ref 78.0–100.0)
MONO ABS: 0.5 10*3/uL (ref 0.1–1.0)
Monocytes Relative: 7 %
Neutro Abs: 5.2 10*3/uL (ref 1.7–7.7)
Neutrophils Relative %: 69 %
PLATELETS: 247 10*3/uL (ref 150–400)
RBC: 4.28 MIL/uL (ref 4.22–5.81)
RDW: 11.6 % (ref 11.5–15.5)
WBC: 7.5 10*3/uL (ref 4.0–10.5)

## 2017-10-06 LAB — LIPASE, BLOOD: LIPASE: 33 U/L (ref 11–51)

## 2017-10-06 MED ORDER — PANTOPRAZOLE SODIUM 40 MG IV SOLR
40.0000 mg | Freq: Once | INTRAVENOUS | Status: AC
  Administered 2017-10-06: 40 mg via INTRAVENOUS
  Filled 2017-10-06: qty 40

## 2017-10-06 MED ORDER — ONDANSETRON HCL 4 MG/2ML IJ SOLN
4.0000 mg | Freq: Once | INTRAMUSCULAR | Status: AC
  Administered 2017-10-06: 4 mg via INTRAVENOUS
  Filled 2017-10-06: qty 2

## 2017-10-06 MED ORDER — HYDROMORPHONE HCL 4 MG PO TABS: 4.0000 mg | ORAL_TABLET | Freq: Four times a day (QID) | ORAL | 0 refills | Status: DC | PRN

## 2017-10-06 MED ORDER — SODIUM CHLORIDE 0.9 % IV BOLUS (SEPSIS)
500.0000 mL | Freq: Once | INTRAVENOUS | Status: AC
  Administered 2017-10-06: 500 mL via INTRAVENOUS

## 2017-10-06 MED ORDER — HYDROMORPHONE HCL 1 MG/ML IJ SOLN
1.0000 mg | Freq: Once | INTRAMUSCULAR | Status: AC
  Administered 2017-10-06: 1 mg via INTRAVENOUS
  Filled 2017-10-06: qty 1

## 2017-10-06 NOTE — ED Triage Notes (Signed)
Pt c/o upper mid abd pain since 0100 this am. Denies n/v/d.

## 2017-10-06 NOTE — ED Notes (Signed)
Pt states pain medication "Norco" is not phasing him a bit

## 2017-10-06 NOTE — ED Provider Notes (Signed)
[facility]ERGENCY DEPARTMENT Provider Note   CSN: [id number] Arrival date & time: 10/06/17  1027     History   Chief Complaint Chief Complaint  Patient presents with  . Abdominal Pain    HPI [name] is a 56 y.o. male.  Patient has epigastric abdominal pain.  He has been worked up with a CT of the abdomen and an ultrasound and is getting an emptying study done Monday   The history is provided by the patient.  Abdominal Pain   This is a recurrent problem. The current episode started 3 to 5 hours ago. The problem occurs constantly. The problem has not changed since onset.The pain is associated with an unknown factor. The pain is located in the epigastric region. The pain is at a severity of 7/10. The pain is moderate. Associated symptoms include anorexia. Pertinent negatives include diarrhea, hematochezia, frequency, hematuria and headaches. Nothing aggravates the symptoms.    Past Medical History:  Diagnosis Date  . Arthritis   . Depression   . Diabetes mellitus   . Pancreatitis   . Renal disorder    kidney stones    There are no active problems to display for this patient.   Past Surgical History:  Procedure Laterality Date  . FINGER AMPUTATION    . HERNIA REPAIR    . KNEE SURGERY    . MENISCUS REPAIR     left and right  . TOTAL KNEE ARTHROPLASTY     Right knee  . VARICOSE VEIN SURGERY         Home Medications    Prior to Admission medications   Medication Sig Start Date End Date Taking? Authorizing Provider  APIDRA 100 UNIT/ML injection 28.75 Units daily by Pump Prime route.  01/22/16   [provider]  ciprofloxacin (CIPRO) 250 MG tablet Take 250 mg 2 (two) times daily by mouth.    [provider]  dicyclomine (BENTYL) 20 MG tablet Take 1 tablet (20 mg total) 2 (two) times daily by mouth. 09/25/17   Eber HongMiller, Brian, MD  HYDROcodone-acetaminophen (NORCO/VICODIN) 5-325 MG tablet Take 1 tablet every 6 (six) hours as needed by mouth  for severe pain. 10/04/17   Gerhard MunchLockwood, Robert, MD  HYDROmorphone (DILAUDID) 4 MG tablet Take 1 tablet (4 mg total) every 6 (six) hours as needed by mouth for severe pain. 10/06/17   Bethann BerkshireZammit, Marykathryn Carboni, MD  metoCLOPramide (REGLAN) 10 MG tablet Take 1 tablet (10 mg total) every 6 (six) hours by mouth. 09/29/17   Donnetta Hutchingook, Brian, MD  Multiple Vitamins-Iron (ONE DAILY MULTIVITAMIN/IRON) TABS Take 1 tablet daily by mouth.    [provider]  pantoprazole (PROTONIX) 40 MG tablet Take 40 mg daily by mouth. 09/19/17   [provider]    Family History History reviewed. No pertinent family history.  Social History Social History   Tobacco Use  . Smoking status: Current Every Day Smoker    Packs/day: 1.00    Types: Cigarettes  . Smokeless tobacco: Never Used  Substance Use Topics  . Alcohol use: No  . Drug use: No     Allergies   Ciprofloxacin-ciprofloxacin hcl; Oxycodone-acetaminophen; and Zolpidem   Review of Systems Review of Systems  Constitutional: Negative for appetite change and fatigue.  HENT: Negative for congestion, ear discharge and sinus pressure.   Eyes: Negative for discharge.  Respiratory: Negative for cough.   Cardiovascular: Negative for chest pain.  Gastrointestinal: Positive for abdominal pain and anorexia. Negative for diarrhea and hematochezia.  Genitourinary:  Negative for frequency and hematuria.  Musculoskeletal: Negative for back pain.  Skin: Negative for rash.  Neurological: Negative for seizures and headaches.  Psychiatric/Behavioral: Negative for hallucinations.     Physical Exam Updated Vital Signs BP 129/66 (BP Location: Right Arm)   Pulse (!) 59   Temp 98.8 F (37.1 C)   Resp 16   Ht 5\' 10"  (1.778 m)   Wt 80.7 kg (178 lb)   SpO2 95%   BMI 25.54 kg/m   Physical Exam  Constitutional: He is oriented to person, place, and time. He appears well-developed.  HENT:  Head: Normocephalic.  Eyes: Conjunctivae and EOM are normal. No  scleral icterus.  Neck: Neck supple. No thyromegaly present.  Cardiovascular: Normal rate and regular rhythm. Exam reveals no gallop and no friction rub.  No murmur heard. Pulmonary/Chest: No stridor. He has no wheezes. He has no rales. He exhibits no tenderness.  Abdominal: He exhibits no distension. There is tenderness. There is no rebound.  Musculoskeletal: Normal range of motion. He exhibits no edema.  Lymphadenopathy:    He has no cervical adenopathy.  Neurological: He is oriented to person, place, and time. He exhibits normal muscle tone. Coordination normal.  Skin: No rash noted. No erythema.  Psychiatric: He has a normal mood and affect. His behavior is normal.     ED Treatments / Results  Labs (all labs ordered are listed, but only abnormal results are displayed) Labs Reviewed  CBC WITH DIFFERENTIAL/PLATELET - Abnormal; Notable for the following components:      Result Value   HCT 38.9 (*)    All other components within normal limits  COMPREHENSIVE METABOLIC PANEL - Abnormal; Notable for the following components:   Chloride 100 (*)    AST 14 (*)    ALT 13 (*)    All other components within normal limits  LIPASE, BLOOD    EKG  EKG Interpretation None       Radiology No results found.  Procedures Procedures (including critical care time)  Medications Ordered in ED Medications  HYDROmorphone (DILAUDID) injection 1 mg (1 mg Intravenous Given 10/06/17 1244)  ondansetron (ZOFRAN) injection 4 mg (4 mg Intravenous Given 10/06/17 1245)  pantoprazole (PROTONIX) injection 40 mg (40 mg Intravenous Given 10/06/17 1244)  sodium chloride 0.9 % bolus 500 mL (0 mLs Intravenous Stopped 10/06/17 1415)     Initial Impression / Assessment and Plan / ED Course  I have reviewed the triage vital signs and the nursing notes.  Pertinent labs & imaging results that were available during my care of the patient were reviewed by me and considered in my medical decision making (see  chart for details).    Labs unremarkable.  Suspect this is an exacerbation of his chronic abdominal pain.  He will be sent home with a few Dilaudid pills since the Vicodin does not help and the Percocet that he is allergic to.  He will follow-up with his doctor as planned Monday for his emptying study  Final Clinical Impressions(s) / ED Diagnoses   Final diagnoses:  Pain of upper abdomen    ED Discharge Orders        Ordered    HYDROmorphone (DILAUDID) 4 MG tablet  Every 6 hours PRN     10/06/17 1538       Bethann BerkshireZammit, Bonnita Newby, MD 10/06/17 1540

## 2017-10-06 NOTE — Discharge Instructions (Signed)
Follow-up with your doctors as planned for your study on Monday

## 2017-10-08 ENCOUNTER — Encounter (HOSPITAL_COMMUNITY)
Admission: RE | Admit: 2017-10-08 | Discharge: 2017-10-08 | Disposition: A | Discharge status: Home / Self Care | Payer: BLUE CROSS/BLUE SHIELD | Source: Ambulatory Visit | Attending: Internal Medicine | Admitting: Internal Medicine

## 2017-10-08 ENCOUNTER — Encounter (HOSPITAL_COMMUNITY): Payer: Self-pay

## 2017-10-08 DIAGNOSIS — R1010 Upper abdominal pain, unspecified: Secondary | ICD-10-CM | POA: Insufficient documentation

## 2017-10-08 MED ORDER — TECHNETIUM TC 99M SULFUR COLLOID
2.0000 | Freq: Once | INTRAVENOUS | Status: AC | PRN
  Administered 2017-10-08: 2.2 via ORAL

## 2017-10-16 ENCOUNTER — Emergency Department (HOSPITAL_COMMUNITY)
Admission: EM | Admit: 2017-10-16 | Discharge: 2017-10-16 | Disposition: A | Discharge status: Home / Self Care | Payer: BLUE CROSS/BLUE SHIELD | Attending: Emergency Medicine | Admitting: Emergency Medicine

## 2017-10-16 ENCOUNTER — Encounter (HOSPITAL_COMMUNITY): Payer: Self-pay

## 2017-10-16 DIAGNOSIS — Z79899 Other long term (current) drug therapy: Secondary | ICD-10-CM | POA: Insufficient documentation

## 2017-10-16 DIAGNOSIS — R1010 Upper abdominal pain, unspecified: Secondary | ICD-10-CM | POA: Insufficient documentation

## 2017-10-16 DIAGNOSIS — R1013 Epigastric pain: Secondary | ICD-10-CM | POA: Diagnosis present

## 2017-10-16 DIAGNOSIS — E119 Type 2 diabetes mellitus without complications: Secondary | ICD-10-CM | POA: Diagnosis not present

## 2017-10-16 DIAGNOSIS — F17210 Nicotine dependence, cigarettes, uncomplicated: Secondary | ICD-10-CM | POA: Diagnosis not present

## 2017-10-16 LAB — CBC WITH DIFFERENTIAL/PLATELET
Basophils Absolute: 0.1 10*3/uL (ref 0.0–0.1)
Basophils Relative: 1 %
EOS ABS: 0.2 10*3/uL (ref 0.0–0.7)
EOS PCT: 3 %
HCT: 44.4 % (ref 39.0–52.0)
Hemoglobin: 14.7 g/dL (ref 13.0–17.0)
LYMPHS PCT: 25 %
Lymphs Abs: 1.7 10*3/uL (ref 0.7–4.0)
MCH: 30.7 pg (ref 26.0–34.0)
MCHC: 33.1 g/dL (ref 30.0–36.0)
MCV: 92.7 fL (ref 78.0–100.0)
MONO ABS: 0.5 10*3/uL (ref 0.1–1.0)
Monocytes Relative: 8 %
Neutro Abs: 4.3 10*3/uL (ref 1.7–7.7)
Neutrophils Relative %: 63 %
PLATELETS: 250 10*3/uL (ref 150–400)
RBC: 4.79 MIL/uL (ref 4.22–5.81)
RDW: 12.5 % (ref 11.5–15.5)
WBC: 6.8 10*3/uL (ref 4.0–10.5)

## 2017-10-16 LAB — COMPREHENSIVE METABOLIC PANEL
ALT: 20 U/L (ref 17–63)
ANION GAP: 11 (ref 5–15)
AST: 21 U/L (ref 15–41)
Albumin: 4.3 g/dL (ref 3.5–5.0)
Alkaline Phosphatase: 84 U/L (ref 38–126)
BUN: 29 mg/dL — ABNORMAL HIGH (ref 6–20)
CHLORIDE: 94 mmol/L — AB (ref 101–111)
CO2: 25 mmol/L (ref 22–32)
CREATININE: 1.17 mg/dL (ref 0.61–1.24)
Calcium: 10.8 mg/dL — ABNORMAL HIGH (ref 8.9–10.3)
Glucose, Bld: 405 mg/dL — ABNORMAL HIGH (ref 65–99)
Potassium: 5.1 mmol/L (ref 3.5–5.1)
SODIUM: 130 mmol/L — AB (ref 135–145)
Total Bilirubin: 1.1 mg/dL (ref 0.3–1.2)
Total Protein: 8.3 g/dL — ABNORMAL HIGH (ref 6.5–8.1)

## 2017-10-16 LAB — CBG MONITORING, ED
Glucose-Capillary: 392 mg/dL — ABNORMAL HIGH (ref 65–99)
Glucose-Capillary: 218 mg/dL — ABNORMAL HIGH (ref 65–99)

## 2017-10-16 MED ORDER — ONDANSETRON HCL 4 MG/2ML IJ SOLN
4.0000 mg | Freq: Once | INTRAMUSCULAR | Status: AC
  Administered 2017-10-16: 4 mg via INTRAVENOUS
  Filled 2017-10-16: qty 2

## 2017-10-16 MED ORDER — TRAMADOL HCL 50 MG PO TABS: 50.0000 mg | ORAL_TABLET | Freq: Four times a day (QID) | ORAL | 0 refills | Status: DC | PRN

## 2017-10-16 MED ORDER — ONDANSETRON 4 MG PO TBDP: ORAL_TABLET | ORAL | 0 refills | Status: DC

## 2017-10-16 MED ORDER — PANTOPRAZOLE SODIUM 40 MG IV SOLR
INTRAVENOUS | Status: AC
  Filled 2017-10-16: qty 40

## 2017-10-16 MED ORDER — HYDROMORPHONE HCL 1 MG/ML IJ SOLN
1.0000 mg | Freq: Once | INTRAMUSCULAR | Status: AC
  Administered 2017-10-16: 1 mg via INTRAVENOUS
  Filled 2017-10-16: qty 1

## 2017-10-16 MED ORDER — SODIUM CHLORIDE 0.9 % IV BOLUS (SEPSIS)
1000.0000 mL | Freq: Once | INTRAVENOUS | Status: AC
  Administered 2017-10-16: 1000 mL via INTRAVENOUS

## 2017-10-16 MED ORDER — PANTOPRAZOLE SODIUM 40 MG IV SOLR
40.0000 mg | Freq: Once | INTRAVENOUS | Status: AC
  Administered 2017-10-16: 40 mg via INTRAVENOUS
  Filled 2017-10-16: qty 40

## 2017-10-16 NOTE — ED Triage Notes (Signed)
Pt reports n/v and hyperglycemia since this morning.  Reports blood sugar has been elevated.  Last cbg at home was 360's.  Pt has taken a total of 17-20 units of novolog insulin pta.

## 2017-10-16 NOTE — Discharge Instructions (Signed)
Follow up with your gi md in 2-3 weeks

## 2017-10-16 NOTE — ED Provider Notes (Signed)
[facility] EMERGENCY DEPARTMENT Provider Note   CSN: [id number] Arrival date & time: 10/16/17  4098     History   Chief Complaint Chief Complaint  Patient presents with  . Abdominal Pain    HPI [name] is a 56 y.o. male.  Patient complains of abdominal pain.  Patient has a history of chronic abdominal pain that his GI doctor believes is related to diabetic neuropathy in his stomach   The history is provided by the patient. No language interpreter was used.  Abdominal Pain   This is a recurrent problem. The current episode started 12 to 24 hours ago. The problem occurs constantly. The problem has not changed since onset.The pain is associated with an unknown factor. The pain is located in the epigastric region. The quality of the pain is aching. The pain is at a severity of 5/10. Pertinent negatives include diarrhea, frequency, hematuria and headaches.    Past Medical History:  Diagnosis Date  . Arthritis   . Depression   . Diabetes mellitus   . Pancreatitis   . Renal disorder    kidney stones    There are no active problems to display for this patient.   Past Surgical History:  Procedure Laterality Date  . FINGER AMPUTATION    . HERNIA REPAIR    . KNEE SURGERY    . MENISCUS REPAIR     left and right  . TOTAL KNEE ARTHROPLASTY     Right knee  . VARICOSE VEIN SURGERY         Home Medications    Prior to Admission medications   Medication Sig Start Date End Date Taking? Authorizing Provider  APIDRA 100 UNIT/ML injection 28.75 Units daily by Pump Prime route.  01/22/16  Yes [provider]  dicyclomine (BENTYL) 20 MG tablet Take 1 tablet (20 mg total) 2 (two) times daily by mouth. 09/25/17  Yes Eber Hong, MD  metoCLOPramide (REGLAN) 10 MG tablet Take 1 tablet (10 mg total) every 6 (six) hours by mouth. 09/29/17  Yes Donnetta Hutching, MD  Multiple Vitamins-Iron (ONE DAILY MULTIVITAMIN/IRON) TABS Take 1 tablet daily by mouth.   Yes [provider]  pantoprazole (PROTONIX) 40 MG tablet Take 40 mg daily by mouth. 09/19/17  Yes [provider]  HYDROcodone-acetaminophen (NORCO/VICODIN) 5-325 MG tablet Take 1 tablet every 6 (six) hours as needed by mouth for severe pain. Patient not taking: Reported on 10/16/2017 10/04/17   Gerhard Munch, MD  HYDROmorphone (DILAUDID) 4 MG tablet Take 1 tablet (4 mg total) every 6 (six) hours as needed by mouth for severe pain. 10/06/17   Bethann Berkshire, MD  ondansetron (ZOFRAN ODT) 4 MG disintegrating tablet 4mg  ODT q4 hours prn nausea/vomit 10/16/17   Bethann Berkshire, MD  traMADol (ULTRAM) 50 MG tablet Take 1 tablet (50 mg total) by mouth every 6 (six) hours as needed. 10/16/17   Bethann Berkshire, MD    Family History No family history on file.  Social History Social History   Tobacco Use  . Smoking status: Current Every Day Smoker    Packs/day: 1.00    Types: Cigarettes  . Smokeless tobacco: Never Used  Substance Use Topics  . Alcohol use: No  . Drug use: No     Allergies   Ciprofloxacin-ciprofloxacin hcl; Oxycodone-acetaminophen; and Zolpidem   Review of Systems Review of Systems  Constitutional: Negative for appetite change and fatigue.  HENT: Negative for congestion, ear discharge and sinus pressure.   Eyes: Negative for  discharge.  Respiratory: Negative for cough.   Cardiovascular: Negative for chest pain.  Gastrointestinal: Positive for abdominal pain. Negative for diarrhea.  Genitourinary: Negative for frequency and hematuria.  Musculoskeletal: Negative for back pain.  Skin: Negative for rash.  Neurological: Negative for seizures and headaches.  Psychiatric/Behavioral: Negative for hallucinations.     Physical Exam Updated Vital Signs BP (!) 148/79 (BP Location: Left Arm)   Pulse 71   Temp 99 F (37.2 C) (Oral)   Resp 18   Ht 5\' 10"  (1.778 m)   Wt 80.7 kg (178 lb)   SpO2 94%   BMI 25.54 kg/m   Physical Exam  Constitutional: He is oriented  to person, place, and time. He appears well-developed.  HENT:  Head: Normocephalic.  Eyes: Conjunctivae and EOM are normal. No scleral icterus.  Neck: Neck supple. No thyromegaly present.  Cardiovascular: Normal rate and regular rhythm. Exam reveals no gallop and no friction rub.  No murmur heard. Pulmonary/Chest: No stridor. He has no wheezes. He has no rales. He exhibits no tenderness.  Abdominal: He exhibits no distension. There is tenderness. There is no rebound.  Musculoskeletal: Normal range of motion. He exhibits no edema.  Lymphadenopathy:    He has no cervical adenopathy.  Neurological: He is oriented to person, place, and time. He exhibits normal muscle tone. Coordination normal.  Skin: No rash noted. No erythema.  Psychiatric: He has a normal mood and affect. His behavior is normal.     ED Treatments / Results  Labs (all labs ordered are listed, but only abnormal results are displayed) Labs Reviewed  COMPREHENSIVE METABOLIC PANEL - Abnormal; Notable for the following components:      Result Value   Sodium 130 (*)    Chloride 94 (*)    Glucose, Bld 405 (*)    BUN 29 (*)    Calcium 10.8 (*)    Total Protein 8.3 (*)    All other components within normal limits  CBG MONITORING, ED - Abnormal; Notable for the following components:   Glucose-Capillary 392 (*)    All other components within normal limits  CBG MONITORING, ED - Abnormal; Notable for the following components:   Glucose-Capillary 218 (*)    All other components within normal limits  CBC WITH DIFFERENTIAL/PLATELET    EKG  EKG Interpretation None       Radiology No results found.  Procedures Procedures (including critical care time)  Medications Ordered in ED Medications  sodium chloride 0.9 % bolus 1,000 mL (0 mLs Intravenous Stopped 10/16/17 1354)  pantoprazole (PROTONIX) injection 40 mg (40 mg Intravenous Given 10/16/17 1030)  HYDROmorphone (DILAUDID) injection 1 mg (1 mg Intravenous Given  10/16/17 1030)  ondansetron (ZOFRAN) injection 4 mg (4 mg Intravenous Given 10/16/17 1030)  sodium chloride 0.9 % bolus 1,000 mL (0 mLs Intravenous Stopped 10/16/17 1130)  HYDROmorphone (DILAUDID) injection 1 mg (1 mg Intravenous Given 10/16/17 1216)  ondansetron (ZOFRAN) injection 4 mg (4 mg Intravenous Given 10/16/17 1216)     Initial Impression / Assessment and Plan / ED Course  I have reviewed the triage vital signs and the nursing notes.  Pertinent labs & imaging results that were available during my care of the patient were reviewed by me and considered in my medical decision making (see chart for details).     Patient's labs unremarkable except for elevated glucose.  Patient improved with pain medicine and will follow up with GI to see if there is any other treatment for this  diabetic neuropathy causing abdominal pain  Final Clinical Impressions(s) / ED Diagnoses   Final diagnoses:  Pain of upper abdomen    ED Discharge Orders        Ordered    traMADol (ULTRAM) 50 MG tablet  Every 6 hours PRN     10/16/17 1421    ondansetron (ZOFRAN ODT) 4 MG disintegrating tablet     10/16/17 1421       Bethann BerkshireZammit, Hydia Copelin, MD 10/16/17 1427

## 2017-10-27 ENCOUNTER — Emergency Department (HOSPITAL_COMMUNITY): Payer: BLUE CROSS/BLUE SHIELD

## 2017-10-27 ENCOUNTER — Emergency Department (HOSPITAL_COMMUNITY)
Admission: EM | Admit: 2017-10-27 | Discharge: 2017-10-27 | Disposition: A | Discharge status: Home / Self Care | Payer: BLUE CROSS/BLUE SHIELD | Attending: Emergency Medicine | Admitting: Emergency Medicine

## 2017-10-27 ENCOUNTER — Encounter (HOSPITAL_COMMUNITY): Payer: Self-pay | Admitting: Emergency Medicine

## 2017-10-27 ENCOUNTER — Other Ambulatory Visit: Payer: Self-pay

## 2017-10-27 DIAGNOSIS — F17210 Nicotine dependence, cigarettes, uncomplicated: Secondary | ICD-10-CM | POA: Insufficient documentation

## 2017-10-27 DIAGNOSIS — R110 Nausea: Secondary | ICD-10-CM | POA: Diagnosis not present

## 2017-10-27 DIAGNOSIS — Z794 Long term (current) use of insulin: Secondary | ICD-10-CM | POA: Diagnosis not present

## 2017-10-27 DIAGNOSIS — Z79899 Other long term (current) drug therapy: Secondary | ICD-10-CM | POA: Insufficient documentation

## 2017-10-27 DIAGNOSIS — Z96651 Presence of right artificial knee joint: Secondary | ICD-10-CM | POA: Diagnosis not present

## 2017-10-27 DIAGNOSIS — R739 Hyperglycemia, unspecified: Secondary | ICD-10-CM

## 2017-10-27 DIAGNOSIS — E1165 Type 2 diabetes mellitus with hyperglycemia: Secondary | ICD-10-CM | POA: Diagnosis not present

## 2017-10-27 DIAGNOSIS — R1013 Epigastric pain: Secondary | ICD-10-CM | POA: Diagnosis present

## 2017-10-27 HISTORY — DX: Other chronic pain: G89.29

## 2017-10-27 HISTORY — DX: Unspecified abdominal pain: R10.9

## 2017-10-27 LAB — CBC WITH DIFFERENTIAL/PLATELET
Basophils Absolute: 0.1 10*3/uL (ref 0.0–0.1)
Basophils Relative: 1 %
EOS ABS: 0.2 10*3/uL (ref 0.0–0.7)
EOS PCT: 3 %
HCT: 45.8 % (ref 39.0–52.0)
Hemoglobin: 15.3 g/dL (ref 13.0–17.0)
LYMPHS ABS: 2.3 10*3/uL (ref 0.7–4.0)
LYMPHS PCT: 26 %
MCH: 30.4 pg (ref 26.0–34.0)
MCHC: 33.4 g/dL (ref 30.0–36.0)
MCV: 91.1 fL (ref 78.0–100.0)
MONO ABS: 0.7 10*3/uL (ref 0.1–1.0)
Monocytes Relative: 8 %
Neutro Abs: 5.4 10*3/uL (ref 1.7–7.7)
Neutrophils Relative %: 62 %
PLATELETS: 234 10*3/uL (ref 150–400)
RBC: 5.03 MIL/uL (ref 4.22–5.81)
RDW: 12.6 % (ref 11.5–15.5)
WBC: 8.6 10*3/uL (ref 4.0–10.5)

## 2017-10-27 LAB — COMPREHENSIVE METABOLIC PANEL
ALT: 16 U/L — AB (ref 17–63)
ANION GAP: 11 (ref 5–15)
AST: 23 U/L (ref 15–41)
Albumin: 3.8 g/dL (ref 3.5–5.0)
Alkaline Phosphatase: 108 U/L (ref 38–126)
BUN: 17 mg/dL (ref 6–20)
CHLORIDE: 95 mmol/L — AB (ref 101–111)
CO2: 24 mmol/L (ref 22–32)
Calcium: 10 mg/dL (ref 8.9–10.3)
Creatinine, Ser: 0.77 mg/dL (ref 0.61–1.24)
Glucose, Bld: 373 mg/dL — ABNORMAL HIGH (ref 65–99)
POTASSIUM: 4.9 mmol/L (ref 3.5–5.1)
SODIUM: 130 mmol/L — AB (ref 135–145)
Total Bilirubin: 0.7 mg/dL (ref 0.3–1.2)
Total Protein: 7.5 g/dL (ref 6.5–8.1)

## 2017-10-27 LAB — URINALYSIS, ROUTINE W REFLEX MICROSCOPIC
BILIRUBIN URINE: NEGATIVE
Bacteria, UA: NONE SEEN
HGB URINE DIPSTICK: NEGATIVE
KETONES UR: NEGATIVE mg/dL
LEUKOCYTES UA: NEGATIVE
NITRITE: NEGATIVE
PH: 7 (ref 5.0–8.0)
PROTEIN: NEGATIVE mg/dL
Specific Gravity, Urine: 1.015 (ref 1.005–1.030)
Squamous Epithelial / LPF: NONE SEEN

## 2017-10-27 LAB — LIPASE, BLOOD: LIPASE: 37 U/L (ref 11–51)

## 2017-10-27 LAB — TROPONIN I

## 2017-10-27 LAB — CBG MONITORING, ED
Glucose-Capillary: 412 mg/dL — ABNORMAL HIGH (ref 65–99)
Glucose-Capillary: 266 mg/dL — ABNORMAL HIGH (ref 65–99)

## 2017-10-27 MED ORDER — METOCLOPRAMIDE HCL 5 MG/ML IJ SOLN
10.0000 mg | Freq: Once | INTRAMUSCULAR | Status: AC
  Administered 2017-10-27: 10 mg via INTRAVENOUS
  Filled 2017-10-27: qty 2

## 2017-10-27 MED ORDER — HYDROMORPHONE HCL 1 MG/ML IJ SOLN
1.0000 mg | Freq: Once | INTRAMUSCULAR | Status: AC
  Administered 2017-10-27: 1 mg via INTRAVENOUS
  Filled 2017-10-27: qty 1

## 2017-10-27 MED ORDER — HYDROCODONE-ACETAMINOPHEN 5-325 MG PO TABS: 1.0000 | ORAL_TABLET | ORAL | 0 refills | Status: DC | PRN

## 2017-10-27 MED ORDER — METOCLOPRAMIDE HCL 10 MG PO TABS: 10.0000 mg | ORAL_TABLET | Freq: Four times a day (QID) | ORAL | 0 refills | Status: DC

## 2017-10-27 MED ORDER — INSULIN ASPART 100 UNIT/ML ~~LOC~~ SOLN
6.0000 [IU] | Freq: Once | SUBCUTANEOUS | Status: AC
  Administered 2017-10-27: 6 [IU] via SUBCUTANEOUS
  Filled 2017-10-27: qty 1

## 2017-10-27 MED ORDER — SODIUM CHLORIDE 0.9 % IV BOLUS (SEPSIS)
1000.0000 mL | Freq: Once | INTRAVENOUS | Status: AC
  Administered 2017-10-27: 1000 mL via INTRAVENOUS

## 2017-10-27 NOTE — ED Notes (Signed)
Advised pt we needed a urine sample 

## 2017-10-27 NOTE — ED Provider Notes (Signed)
[facility] EMERGENCY DEPARTMENT Provider Note   CSN: [id number] Arrival date & time: 10/27/17  4098     History   Chief Complaint Chief Complaint  Patient presents with  . Abdominal Pain    HPI [name] is a 56 y.o. male with a history of uncontrolled diabetes, pancreatitis, arthritis, chronic abdominal pain presenting with epigastric pain along with nausea without vomiting which woke him around 3 AM this morning.  Additionally he has had no fevers or chills.  He states his pain is similar to prior episodes of pancreatitis.  His last meal included steak and baked potato around 5 PM last night.  He took tramadol this morning without relief of symptoms, in fact states he feels it made his pain worse.  His blood glucose levels have been poorly controlled recently and he is working with his PCP in [place] on this issue, states his last CBG check at home was 393.  He denies diarrhea, dysuria chest pain, shortness of breath or back pain.  He does endorse however his abdominal pain is worsened with deep inspiration.  He also mentions he has a hiatal hernia but denies any significant GERD or reflux type symptoms.  His last BM was yesterday evening and normal.  The history is provided by the patient.    Past Medical History:  Diagnosis Date  . Arthritis   . Chronic abdominal pain   . Depression   . Diabetes mellitus   . Pancreatitis   . Renal disorder    kidney stones    There are no active problems to display for this patient.   Past Surgical History:  Procedure Laterality Date  . FINGER AMPUTATION    . HERNIA REPAIR    . KNEE SURGERY    . MENISCUS REPAIR     left and right  . TOTAL KNEE ARTHROPLASTY     Right knee  . VARICOSE VEIN SURGERY         Home Medications    Prior to Admission medications   Medication Sig Start Date End Date Taking? Authorizing Provider  dicyclomine (BENTYL) 20 MG tablet Take 1 tablet (20 mg total) 2 (two) times daily by mouth. 09/25/17   Yes Eber Hong, MD  HUMALOG KWIKPEN 100 UNIT/ML KiwkPen Inject 15 Units into the skin 3 (three) times daily. 10/19/17  Yes [provider]  Multiple Vitamins-Iron (ONE DAILY MULTIVITAMIN/IRON) TABS Take 1 tablet daily by mouth.   Yes [provider]  ondansetron (ZOFRAN ODT) 4 MG disintegrating tablet 4mg  ODT q4 hours prn nausea/vomit 10/16/17  Yes Bethann Berkshire, MD  pantoprazole (PROTONIX) 40 MG tablet Take 40 mg daily by mouth. 09/19/17  Yes [provider]  traMADol (ULTRAM) 50 MG tablet Take 1 tablet (50 mg total) by mouth every 6 (six) hours as needed. 10/16/17  Yes Bethann Berkshire, MD  APIDRA 100 UNIT/ML injection 28.75 Units daily by Pump Prime route.  01/22/16   [provider]  HYDROcodone-acetaminophen (NORCO/VICODIN) 5-325 MG tablet Take 1 tablet by mouth every 4 (four) hours as needed. 10/27/17   Burgess Amor, PA-C  HYDROmorphone (DILAUDID) 4 MG tablet Take 1 tablet (4 mg total) every 6 (six) hours as needed by mouth for severe pain. Patient not taking: Reported on 10/27/2017 10/06/17   Bethann Berkshire, MD  metoCLOPramide (REGLAN) 10 MG tablet Take 1 tablet (10 mg total) by mouth every 6 (six) hours. 10/27/17   Burgess Amor, PA-C    Family History History reviewed. No pertinent family  history.  Social History Social History   Tobacco Use  . Smoking status: Current Every Day Smoker    Packs/day: 1.00    Types: Cigarettes  . Smokeless tobacco: Never Used  Substance Use Topics  . Alcohol use: No  . Drug use: No     Allergies   Ciprofloxacin-ciprofloxacin hcl; Oxycodone-acetaminophen; and Zolpidem   Review of Systems Review of Systems  Constitutional: Negative for chills and fever.  HENT: Negative for congestion and sore throat.   Eyes: Negative.   Respiratory: Negative for chest tightness and shortness of breath.   Cardiovascular: Negative for chest pain.  Gastrointestinal: Positive for abdominal pain and nausea. Negative for  constipation, diarrhea and vomiting.  Genitourinary: Negative.   Musculoskeletal: Negative for arthralgias, joint swelling and neck pain.  Skin: Negative.  Negative for rash and wound.  Neurological: Negative for dizziness, weakness, light-headedness, numbness and headaches.  Psychiatric/Behavioral: Negative.      Physical Exam Updated Vital Signs BP 132/73 (BP Location: Right Arm) Comment: Simultaneous filing. User may not have seen previous data.  Pulse (!) 44 Comment: Simultaneous filing. User may not have seen previous data.  Temp 98.3 F (36.8 C) (Oral)   Resp 17 Comment: Simultaneous filing. User may not have seen previous data.  Ht 5\' 10"  (1.778 m)   Wt 81.6 kg (180 lb)   SpO2 95% Comment: Simultaneous filing. User may not have seen previous data.  BMI 25.83 kg/m   Physical Exam  Constitutional: He appears well-developed and well-nourished.  HENT:  Head: Normocephalic and atraumatic.  Eyes: Conjunctivae are normal.  Neck: Normal range of motion.  Cardiovascular: Normal rate, regular rhythm, normal heart sounds and intact distal pulses.  Pulmonary/Chest: Effort normal and breath sounds normal. He has no wheezes.  Abdominal: Soft. Bowel sounds are normal. He exhibits no distension. There is tenderness in the epigastric area and left upper quadrant. There is no rigidity and no guarding.  Musculoskeletal: Normal range of motion.  Neurological: He is alert.  Skin: Skin is warm and dry.  Psychiatric: He has a normal mood and affect.  Nursing note and vitals reviewed.    ED Treatments / Results  Labs (all labs ordered are listed, but only abnormal results are displayed)  Results for orders placed or performed during the hospital encounter of 10/27/17  CBC with Differential  Result Value Ref Range   WBC 8.6 4.0 - 10.5 K/uL   RBC 5.03 4.22 - 5.81 MIL/uL   Hemoglobin 15.3 13.0 - 17.0 g/dL   HCT 40.945.8 81.139.0 - 91.452.0 %   MCV 91.1 78.0 - 100.0 fL   MCH 30.4 26.0 - 34.0 pg    MCHC 33.4 30.0 - 36.0 g/dL   RDW 78.212.6 95.611.5 - 21.315.5 %   Platelets 234 150 - 400 K/uL   Neutrophils Relative % 62 %   Neutro Abs 5.4 1.7 - 7.7 K/uL   Lymphocytes Relative 26 %   Lymphs Abs 2.3 0.7 - 4.0 K/uL   Monocytes Relative 8 %   Monocytes Absolute 0.7 0.1 - 1.0 K/uL   Eosinophils Relative 3 %   Eosinophils Absolute 0.2 0.0 - 0.7 K/uL   Basophils Relative 1 %   Basophils Absolute 0.1 0.0 - 0.1 K/uL  Comprehensive metabolic panel  Result Value Ref Range   Sodium 130 (L) 135 - 145 mmol/L   Potassium 4.9 3.5 - 5.1 mmol/L   Chloride 95 (L) 101 - 111 mmol/L   CO2 24 22 - 32 mmol/L  Glucose, Bld 373 (H) 65 - 99 mg/dL   BUN 17 6 - 20 mg/dL   Creatinine, Ser 1.610.77 0.61 - 1.24 mg/dL   Calcium 09.610.0 8.9 - 04.510.3 mg/dL   Total Protein 7.5 6.5 - 8.1 g/dL   Albumin 3.8 3.5 - 5.0 g/dL   AST 23 15 - 41 U/L   ALT 16 (L) 17 - 63 U/L   Alkaline Phosphatase 108 38 - 126 U/L   Total Bilirubin 0.7 0.3 - 1.2 mg/dL   GFR calc non Af Amer >60 >60 mL/min   GFR calc Af Amer >60 >60 mL/min   Anion gap 11 5 - 15  Lipase, blood  Result Value Ref Range   Lipase 37 11 - 51 U/L  Urinalysis, Routine w reflex microscopic  Result Value Ref Range   Color, Urine STRAW (A) YELLOW   APPearance CLEAR CLEAR   Specific Gravity, Urine 1.015 1.005 - 1.030   pH 7.0 5.0 - 8.0   Glucose, UA >=500 (A) NEGATIVE mg/dL   Hgb urine dipstick NEGATIVE NEGATIVE   Bilirubin Urine NEGATIVE NEGATIVE   Ketones, ur NEGATIVE NEGATIVE mg/dL   Protein, ur NEGATIVE NEGATIVE mg/dL   Nitrite NEGATIVE NEGATIVE   Leukocytes, UA NEGATIVE NEGATIVE   RBC / HPF 0-5 0 - 5 RBC/hpf   WBC, UA 0-5 0 - 5 WBC/hpf   Bacteria, UA NONE SEEN NONE SEEN   Squamous Epithelial / LPF NONE SEEN NONE SEEN   Hyaline Casts, UA PRESENT   Troponin I  Result Value Ref Range   Troponin I <0.03 <0.03 ng/mL  CBG monitoring, ED  Result Value Ref Range   Glucose-Capillary 412 (H) 65 - 99 mg/dL  POC CBG, ED  Result Value Ref Range   Glucose-Capillary 266  (H) 65 - 99 mg/dL   Comment 1 Document in Chart       EKG  EKG Interpretation  Date/Time:  Saturday October 27 2017 09:53:36 EST Ventricular Rate:  74 PR Interval:    QRS Duration: 92 QT Interval:  394 QTC Calculation: 438 R Axis:   63 Text Interpretation:  Sinus rhythm Multiple ventricular premature complexes When compared with ECG of 09/07/2015 Premature ventricular complexes are now Present Confirmed by Samuel JesterMcManus, Kathleen [id number]) on 10/27/2017 11:01:44 AM       Radiology Dg Abd Acute W/chest  Result Date: 10/27/2017 CLINICAL DATA:  Epigastric pain, history of pancreatitis EXAM: DG ABDOMEN ACUTE W/ 1V CHEST COMPARISON:  None. FINDINGS: Heart size and mediastinal contours are within normal limits. Lungs are clear. No effusion. No free air. A few gas filled nondilated small bowel loops in the right mid abdomen. Colon nondilated. Moderate fecal material in the proximal descending segment, decompressed distally. There are no abnormal calcifications. Surgical clips over the right common femoral vessels. Regional bones unremarkable. IMPRESSION: No acute cardiopulmonary disease. Nonobstructive bowel gas pattern.  No free air. Electronically Signed   By: Corlis Leak  Hassell M.D.   On: 10/27/2017 10:33    Procedures Procedures (including critical care time)  Medications Ordered in ED Medications  HYDROmorphone (DILAUDID) injection 1 mg (1 mg Intravenous Given 10/27/17 0834)  metoCLOPramide (REGLAN) injection 10 mg (10 mg Intravenous Given 10/27/17 0834)  sodium chloride 0.9 % bolus 1,000 mL (0 mLs Intravenous Stopped 10/27/17 1014)  insulin aspart (novoLOG) injection 6 Units (6 Units Subcutaneous Given 10/27/17 0834)     Initial Impression / Assessment and Plan / ED Course  I have reviewed the triage vital signs and the nursing notes.  Pertinent labs & imaging results that were available during my care of the patient were reviewed by me and considered in my medical decision making (see chart for  details).     Patient symptoms resolved with IV fluids, Reglan and Dilaudid 1 mg x1.  He endorses he is currently being followed by Dr. Dionicia Abler who has speculated that chronic intermittent symptoms may be gastroparesis but has not started treatment for this possibility yet.  I will send him home with a prescription for Reglan which he may try in place of his home Zofran.  Strict return precautions discussed, specifically worsening pain, fevers, uncontrolled vomiting.  Reexam of abdomen nonacute with no pain or guarding present.  Final Clinical Impressions(s) / ED Diagnoses   Final diagnoses:  Nausea  Hyperglycemia without ketosis    ED Discharge Orders        Ordered    metoCLOPramide (REGLAN) 10 MG tablet  Every 6 hours     10/27/17 1140    HYDROcodone-acetaminophen (NORCO/VICODIN) 5-325 MG tablet  Every 4 hours PRN     10/27/17 1140       Burgess Amor, PA-C 10/27/17 1140    Samuel Jester, DO 10/27/17 1443

## 2017-10-27 NOTE — ED Notes (Signed)
Patient returned from xray.

## 2017-10-27 NOTE — ED Triage Notes (Signed)
Pt c/o ABD pain MUQ that started at 2 am this morning. Pt states "I cant get control no my sugar because the last time I checked, it was 393" NAD.

## 2017-10-27 NOTE — Discharge Instructions (Signed)
As discussed, try Reglan in place of your Zofran which may improve your symptoms better, especially if you do have this condition called diabetic gastroparesis.  Follow-up with Dr. Karilyn Cotaehman as scheduled. You may take the hydrocodone prescribed for pain relief.  This will make you drowsy - do not drive within 4 hours of taking this medication.

## 2017-11-09 ENCOUNTER — Emergency Department (HOSPITAL_COMMUNITY)
Admission: EM | Admit: 2017-11-09 | Discharge: 2017-11-09 | Disposition: A | Discharge status: Home / Self Care | Payer: BLUE CROSS/BLUE SHIELD | Attending: Emergency Medicine | Admitting: Emergency Medicine

## 2017-11-09 ENCOUNTER — Encounter (HOSPITAL_COMMUNITY): Payer: Self-pay | Admitting: Emergency Medicine

## 2017-11-09 ENCOUNTER — Other Ambulatory Visit: Payer: Self-pay

## 2017-11-09 DIAGNOSIS — Z794 Long term (current) use of insulin: Secondary | ICD-10-CM | POA: Diagnosis not present

## 2017-11-09 DIAGNOSIS — R1010 Upper abdominal pain, unspecified: Secondary | ICD-10-CM | POA: Diagnosis present

## 2017-11-09 DIAGNOSIS — Z79899 Other long term (current) drug therapy: Secondary | ICD-10-CM | POA: Insufficient documentation

## 2017-11-09 DIAGNOSIS — E119 Type 2 diabetes mellitus without complications: Secondary | ICD-10-CM | POA: Diagnosis not present

## 2017-11-09 DIAGNOSIS — Z96651 Presence of right artificial knee joint: Secondary | ICD-10-CM | POA: Diagnosis not present

## 2017-11-09 DIAGNOSIS — F17210 Nicotine dependence, cigarettes, uncomplicated: Secondary | ICD-10-CM | POA: Diagnosis not present

## 2017-11-09 LAB — CBC WITH DIFFERENTIAL/PLATELET
BASOS ABS: 0.1 10*3/uL (ref 0.0–0.1)
Basophils Relative: 1 %
EOS PCT: 3 %
Eosinophils Absolute: 0.3 10*3/uL (ref 0.0–0.7)
HCT: 41.4 % (ref 39.0–52.0)
Hemoglobin: 14.2 g/dL (ref 13.0–17.0)
LYMPHS PCT: 25 %
Lymphs Abs: 2.4 10*3/uL (ref 0.7–4.0)
MCH: 30.9 pg (ref 26.0–34.0)
MCHC: 34.3 g/dL (ref 30.0–36.0)
MCV: 90.2 fL (ref 78.0–100.0)
MONO ABS: 0.6 10*3/uL (ref 0.1–1.0)
Monocytes Relative: 7 %
Neutro Abs: 6.1 10*3/uL (ref 1.7–7.7)
Neutrophils Relative %: 64 %
PLATELETS: 225 10*3/uL (ref 150–400)
RBC: 4.59 MIL/uL (ref 4.22–5.81)
RDW: 12.8 % (ref 11.5–15.5)
WBC: 9.5 10*3/uL (ref 4.0–10.5)

## 2017-11-09 LAB — COMPREHENSIVE METABOLIC PANEL
ALT: 13 U/L — ABNORMAL LOW (ref 17–63)
AST: 16 U/L (ref 15–41)
Albumin: 4 g/dL (ref 3.5–5.0)
Alkaline Phosphatase: 93 U/L (ref 38–126)
Anion gap: 12 (ref 5–15)
BUN: 16 mg/dL (ref 6–20)
CHLORIDE: 98 mmol/L — AB (ref 101–111)
CO2: 22 mmol/L (ref 22–32)
Calcium: 9.6 mg/dL (ref 8.9–10.3)
Creatinine, Ser: 0.82 mg/dL (ref 0.61–1.24)
Glucose, Bld: 219 mg/dL — ABNORMAL HIGH (ref 65–99)
POTASSIUM: 4.2 mmol/L (ref 3.5–5.1)
Sodium: 132 mmol/L — ABNORMAL LOW (ref 135–145)
Total Bilirubin: 0.8 mg/dL (ref 0.3–1.2)
Total Protein: 7.7 g/dL (ref 6.5–8.1)

## 2017-11-09 LAB — LIPASE, BLOOD: LIPASE: 41 U/L (ref 11–51)

## 2017-11-09 MED ORDER — SODIUM CHLORIDE 0.9 % IV BOLUS (SEPSIS)
1000.0000 mL | Freq: Once | INTRAVENOUS | Status: AC
  Administered 2017-11-09: 1000 mL via INTRAVENOUS

## 2017-11-09 MED ORDER — PANTOPRAZOLE SODIUM 40 MG IV SOLR
40.0000 mg | Freq: Once | INTRAVENOUS | Status: AC
  Administered 2017-11-09: 40 mg via INTRAVENOUS
  Filled 2017-11-09: qty 40

## 2017-11-09 MED ORDER — ONDANSETRON HCL 4 MG/2ML IJ SOLN
4.0000 mg | Freq: Once | INTRAMUSCULAR | Status: AC
  Administered 2017-11-09: 4 mg via INTRAVENOUS
  Filled 2017-11-09: qty 2

## 2017-11-09 MED ORDER — HYDROMORPHONE HCL 1 MG/ML IJ SOLN
1.0000 mg | Freq: Once | INTRAMUSCULAR | Status: AC
  Administered 2017-11-09: 1 mg via INTRAVENOUS
  Filled 2017-11-09: qty 1

## 2017-11-09 MED ORDER — HYDROCODONE-ACETAMINOPHEN 5-325 MG PO TABS: 1.0000 | ORAL_TABLET | Freq: Four times a day (QID) | ORAL | 0 refills | Status: DC | PRN

## 2017-11-09 NOTE — ED Triage Notes (Addendum)
Pt reports blood sugar of 197 today  Also abd pain Pt has hx of chronic abd pain  States he called called his physician who suggested he come to ED  Pt also reports he ran out of his pain meds on Sunday

## 2017-11-09 NOTE — ED Provider Notes (Signed)
[facility] EMERGENCY DEPARTMENT Provider Note   CSN: [id number] Arrival date & time: 11/09/17  1457     History   Chief Complaint Chief Complaint  Patient presents with  . Abdominal Pain    HPI [name] is a 56 y.o. male.  Patient complains of abdominal discomfort.  Patient has a history of chronic abdominal pain and has been worked up recently by GI doctor   The history is provided by the patient.  Abdominal Pain   This is a new problem. The current episode started yesterday. The problem occurs constantly. The problem has not changed since onset.The pain is associated with an unknown factor. The pain is located in the epigastric region. The pain is at a severity of 7/10. Pertinent negatives include diarrhea, frequency, hematuria and headaches.    Past Medical History:  Diagnosis Date  . Arthritis   . Chronic abdominal pain   . Depression   . Diabetes mellitus   . Pancreatitis   . Renal disorder    kidney stones    There are no active problems to display for this patient.   Past Surgical History:  Procedure Laterality Date  . FINGER AMPUTATION    . HERNIA REPAIR    . KNEE SURGERY    . MENISCUS REPAIR     left and right  . TOTAL KNEE ARTHROPLASTY     Right knee  . VARICOSE VEIN SURGERY         Home Medications    Prior to Admission medications   Medication Sig Start Date End Date Taking? Authorizing Provider  Canagliflozin-Metformin HCl (INVOKAMET) 50-1000 MG TABS Take by mouth every morning.   Yes [provider]  dicyclomine (BENTYL) 20 MG tablet Take 1 tablet (20 mg total) 2 (two) times daily by mouth. 09/25/17  Yes Eber HongMiller, Brian, MD  gabapentin (NEURONTIN) 100 MG capsule Take 100 mg by mouth 2 (two) times daily.   Yes [provider]  HYDROmorphone (DILAUDID) 4 MG tablet Take 1 tablet (4 mg total) every 6 (six) hours as needed by mouth for severe pain. 10/06/17  Yes Bethann BerkshireZammit, Elzie Knisley, MD  Insulin Glargine-Lixisenatide (SOLIQUA)  100-33 UNT-MCG/ML SOPN Inject 15 Units into the skin daily.    Yes [provider]  metoCLOPramide (REGLAN) 10 MG tablet Take 1 tablet (10 mg total) by mouth every 6 (six) hours. 10/27/17  Yes Idol, Raynelle FanningJulie, PA-C  Multiple Vitamins-Iron (ONE DAILY MULTIVITAMIN/IRON) TABS Take 1 tablet daily by mouth.   Yes [provider]  pantoprazole (PROTONIX) 40 MG tablet Take 40 mg daily by mouth. 09/19/17  Yes [provider]  HYDROcodone-acetaminophen (NORCO/VICODIN) 5-325 MG tablet Take 1 tablet by mouth every 6 (six) hours as needed for moderate pain. 11/09/17   Bethann BerkshireZammit, Michela Herst, MD  ondansetron (ZOFRAN ODT) 4 MG disintegrating tablet 4mg  ODT q4 hours prn nausea/vomit 10/16/17   Bethann BerkshireZammit, Akiko Schexnider, MD  traMADol (ULTRAM) 50 MG tablet Take 1 tablet (50 mg total) by mouth every 6 (six) hours as needed. Patient not taking: Reported on 11/09/2017 10/16/17   Bethann BerkshireZammit, Kiaan Overholser, MD    Family History No family history on file.  Social History Social History   Tobacco Use  . Smoking status: Current Every Day Smoker    Packs/day: 1.00    Types: Cigarettes  . Smokeless tobacco: Never Used  Substance Use Topics  . Alcohol use: No  . Drug use: No     Allergies   Ciprofloxacin-ciprofloxacin hcl; Oxycodone-acetaminophen; and Zolpidem   Review of  Systems Review of Systems  Constitutional: Negative for appetite change and fatigue.  HENT: Negative for congestion, ear discharge and sinus pressure.   Eyes: Negative for discharge.  Respiratory: Negative for cough.   Cardiovascular: Negative for chest pain.  Gastrointestinal: Positive for abdominal pain. Negative for diarrhea.  Genitourinary: Negative for frequency and hematuria.  Musculoskeletal: Negative for back pain.  Skin: Negative for rash.  Neurological: Negative for seizures and headaches.  Psychiatric/Behavioral: Negative for hallucinations.     Physical Exam Updated Vital Signs BP (!) 157/86 (BP Location: Left Arm)   Pulse  94   Temp 98.6 F (37 C) (Oral)   Resp 18   Ht 5\' 10"  (1.778 m)   Wt 81.6 kg (180 lb)   SpO2 97%   BMI 25.83 kg/m   Physical Exam  Constitutional: He is oriented to person, place, and time. He appears well-developed.  HENT:  Head: Normocephalic.  Eyes: Conjunctivae and EOM are normal. No scleral icterus.  Neck: Neck supple. No thyromegaly present.  Cardiovascular: Normal rate and regular rhythm. Exam reveals no gallop and no friction rub.  No murmur heard. Pulmonary/Chest: No stridor. He has no wheezes. He has no rales. He exhibits no tenderness.  Abdominal: He exhibits no distension. There is tenderness. There is no rebound.  Tender epigastric   Musculoskeletal: Normal range of motion. He exhibits no edema.  Lymphadenopathy:    He has no cervical adenopathy.  Neurological: He is oriented to person, place, and time. He exhibits normal muscle tone. Coordination normal.  Skin: No rash noted. No erythema.  Psychiatric: He has a normal mood and affect. His behavior is normal.     ED Treatments / Results  Labs (all labs ordered are listed, but only abnormal results are displayed) Labs Reviewed  COMPREHENSIVE METABOLIC PANEL - Abnormal; Notable for the following components:      Result Value   Sodium 132 (*)    Chloride 98 (*)    Glucose, Bld 219 (*)    ALT 13 (*)    All other components within normal limits  CBC WITH DIFFERENTIAL/PLATELET  LIPASE, BLOOD    EKG  EKG Interpretation None       Radiology No results found.  Procedures Procedures (including critical care time)  Medications Ordered in ED Medications  pantoprazole (PROTONIX) injection 40 mg (40 mg Intravenous Given 11/09/17 1609)  HYDROmorphone (DILAUDID) injection 1 mg (1 mg Intravenous Given 11/09/17 1609)  ondansetron (ZOFRAN) injection 4 mg (4 mg Intravenous Given 11/09/17 1608)  sodium chloride 0.9 % bolus 1,000 mL (1,000 mLs Intravenous New Bag/Given 11/09/17 1608)     Initial Impression /  Assessment and Plan / ED Course  I have reviewed the triage vital signs and the nursing notes.  Pertinent labs & imaging results that were available during my care of the patient were reviewed by me and considered in my medical decision making (see chart for details). Patient improved with pain medicine.  Labs unremarkable.  Patient is sent home with some Vicodin and will follow up with his GI doctor      Final Clinical Impressions(s) / ED Diagnoses   Final diagnoses:  Pain of upper abdomen    ED Discharge Orders        Ordered    HYDROcodone-acetaminophen (NORCO/VICODIN) 5-325 MG tablet  Every 6 hours PRN     11/09/17 1732       Bethann BerkshireZammit, Gabbi Whetstone, MD 11/09/17 1737

## 2017-11-09 NOTE — Discharge Instructions (Signed)
Follow back up with your gastroenterologist in a week

## 2017-11-18 ENCOUNTER — Encounter (HOSPITAL_COMMUNITY): Payer: Self-pay

## 2017-11-18 ENCOUNTER — Other Ambulatory Visit: Payer: Self-pay

## 2017-11-18 ENCOUNTER — Emergency Department (HOSPITAL_COMMUNITY)
Admission: EM | Admit: 2017-11-18 | Discharge: 2017-11-18 | Disposition: A | Discharge status: Home / Self Care | Payer: BLUE CROSS/BLUE SHIELD | Attending: Emergency Medicine | Admitting: Emergency Medicine

## 2017-11-18 DIAGNOSIS — R739 Hyperglycemia, unspecified: Secondary | ICD-10-CM

## 2017-11-18 DIAGNOSIS — Z79899 Other long term (current) drug therapy: Secondary | ICD-10-CM | POA: Diagnosis not present

## 2017-11-18 DIAGNOSIS — R1013 Epigastric pain: Secondary | ICD-10-CM | POA: Diagnosis present

## 2017-11-18 DIAGNOSIS — E1165 Type 2 diabetes mellitus with hyperglycemia: Secondary | ICD-10-CM | POA: Insufficient documentation

## 2017-11-18 DIAGNOSIS — G43A0 Cyclical vomiting, not intractable: Secondary | ICD-10-CM

## 2017-11-18 DIAGNOSIS — G8929 Other chronic pain: Secondary | ICD-10-CM | POA: Insufficient documentation

## 2017-11-18 DIAGNOSIS — Z96651 Presence of right artificial knee joint: Secondary | ICD-10-CM | POA: Diagnosis not present

## 2017-11-18 DIAGNOSIS — F17210 Nicotine dependence, cigarettes, uncomplicated: Secondary | ICD-10-CM | POA: Diagnosis not present

## 2017-11-18 DIAGNOSIS — Z794 Long term (current) use of insulin: Secondary | ICD-10-CM | POA: Insufficient documentation

## 2017-11-18 DIAGNOSIS — R109 Unspecified abdominal pain: Secondary | ICD-10-CM

## 2017-11-18 LAB — URINALYSIS, ROUTINE W REFLEX MICROSCOPIC
Bacteria, UA: NONE SEEN
Bilirubin Urine: NEGATIVE
Hgb urine dipstick: NEGATIVE
Ketones, ur: NEGATIVE mg/dL
Leukocytes, UA: NEGATIVE
Nitrite: NEGATIVE
PH: 6 (ref 5.0–8.0)
PROTEIN: NEGATIVE mg/dL
Specific Gravity, Urine: 1.01 (ref 1.005–1.030)

## 2017-11-18 LAB — COMPREHENSIVE METABOLIC PANEL
ALT: 13 U/L — AB (ref 17–63)
AST: 14 U/L — AB (ref 15–41)
Albumin: 3.8 g/dL (ref 3.5–5.0)
Alkaline Phosphatase: 85 U/L (ref 38–126)
Anion gap: 10 (ref 5–15)
BUN: 26 mg/dL — AB (ref 6–20)
CHLORIDE: 99 mmol/L — AB (ref 101–111)
CO2: 25 mmol/L (ref 22–32)
CREATININE: 1.27 mg/dL — AB (ref 0.61–1.24)
Calcium: 9.6 mg/dL (ref 8.9–10.3)
GFR calc Af Amer: >60 mL/min (ref >60)
GFR calc non Af Amer: >60 mL/min (ref >60)
GLUCOSE: 338 mg/dL — AB (ref 65–99)
Potassium: 4.5 mmol/L (ref 3.5–5.1)
SODIUM: 134 mmol/L — AB (ref 135–145)
Total Bilirubin: 0.8 mg/dL (ref 0.3–1.2)
Total Protein: 7.5 g/dL (ref 6.5–8.1)

## 2017-11-18 LAB — CBC
HCT: 42.1 % (ref 39.0–52.0)
Hemoglobin: 14 g/dL (ref 13.0–17.0)
MCH: 30.5 pg (ref 26.0–34.0)
MCHC: 33.3 g/dL (ref 30.0–36.0)
MCV: 91.7 fL (ref 78.0–100.0)
PLATELETS: 238 10*3/uL (ref 150–400)
RBC: 4.59 MIL/uL (ref 4.22–5.81)
RDW: 13 % (ref 11.5–15.5)
WBC: 9.1 10*3/uL (ref 4.0–10.5)

## 2017-11-18 LAB — CBG MONITORING, ED
GLUCOSE-CAPILLARY: 287 mg/dL — AB (ref 65–99)
Glucose-Capillary: 317 mg/dL — ABNORMAL HIGH (ref 65–99)

## 2017-11-18 LAB — LIPASE, BLOOD: LIPASE: 39 U/L (ref 11–51)

## 2017-11-18 MED ORDER — PROMETHAZINE HCL 25 MG RE SUPP: 25.0000 mg | Freq: Four times a day (QID) | RECTAL | 0 refills | Status: DC | PRN

## 2017-11-18 MED ORDER — PROMETHAZINE HCL 25 MG/ML IJ SOLN
12.5000 mg | Freq: Once | INTRAMUSCULAR | Status: AC
  Administered 2017-11-18: 12.5 mg via INTRAVENOUS
  Filled 2017-11-18: qty 1

## 2017-11-18 MED ORDER — METOCLOPRAMIDE HCL 5 MG/ML IJ SOLN
10.0000 mg | Freq: Once | INTRAMUSCULAR | Status: AC
  Administered 2017-11-18: 10 mg via INTRAVENOUS
  Filled 2017-11-18: qty 2

## 2017-11-18 MED ORDER — INSULIN ASPART 100 UNIT/ML ~~LOC~~ SOLN
6.0000 [IU] | Freq: Once | SUBCUTANEOUS | Status: AC
  Administered 2017-11-18: 6 [IU] via SUBCUTANEOUS
  Filled 2017-11-18: qty 1

## 2017-11-18 MED ORDER — SODIUM CHLORIDE 0.9 % IV BOLUS (SEPSIS)
1000.0000 mL | Freq: Once | INTRAVENOUS | Status: AC
  Administered 2017-11-18: 1000 mL via INTRAVENOUS

## 2017-11-18 MED ORDER — HYDROMORPHONE HCL 1 MG/ML IJ SOLN
1.0000 mg | Freq: Once | INTRAMUSCULAR | Status: AC
  Administered 2017-11-18: 1 mg via INTRAVENOUS
  Filled 2017-11-18: qty 1

## 2017-11-18 NOTE — Discharge Instructions (Signed)
You may try the phenergan suppository if you have another episode and the nausea tablets are ineffective due to vomiting.

## 2017-11-18 NOTE — ED Triage Notes (Addendum)
Reports of abdominal pain with nausea that started this morning. Also states glucose has not been able to be controlled with medications.   CGB 317

## 2017-11-18 NOTE — ED Provider Notes (Signed)
[facility] EMERGENCY DEPARTMENT Provider Note   CSN: [id number] Arrival date & time: 11/18/17  1851     History   Chief Complaint Chief Complaint  Patient presents with  . Abdominal Pain  . Hyperglycemia    HPI [name] is a 56 y.o. male with a history of uncontrolled diabetes, pancreatitis, arthritis, chronic abdominal pain presenting with epigastric pain along with nausea without vomiting which started this morning.  He denies fevers or chills.  He states his pain is similar to prior episodes of pancreatitis but his GI specialist suspects diabetic gastroparesis, but since his sx did not respond to a course of reglan he is anticipating referral to a GI specialist at [facility] next week.  His blood glucose levels have been poorly controlled and episodes of pain tend to occur when his cbgs are elevated.   He denies diarrhea, dysuria chest pain, shortness of breath or back pain.  He has a hiatal hernia but denies any significant GERD or reflux type symptoms.  Denies bowel changes.  The history is provided by the patient.   Past Medical History:  Diagnosis Date  . Arthritis   . Chronic abdominal pain   . Depression   . Diabetes mellitus   . Pancreatitis   . Renal disorder    kidney stones    There are no active problems to display for this patient.   Past Surgical History:  Procedure Laterality Date  . FINGER AMPUTATION    . HERNIA REPAIR    . KNEE SURGERY    . MENISCUS REPAIR     left and right  . TOTAL KNEE ARTHROPLASTY     Right knee  . VARICOSE VEIN SURGERY         Home Medications    Prior to Admission medications   Medication Sig Start Date End Date Taking? Authorizing Provider  Canagliflozin-Metformin HCl (INVOKAMET) 50-1000 MG TABS Take by mouth every morning.   Yes [provider]  Cholecalciferol (VITAMIN D3) 50000 units CAPS Take 1 capsule by mouth every 7 (seven) days. 11/09/17  Yes [provider]  dicyclomine (BENTYL) 20 MG  tablet Take 1 tablet (20 mg total) 2 (two) times daily by mouth. 09/25/17  Yes Eber HongMiller, Brian, MD  gabapentin (NEURONTIN) 100 MG capsule Take 100 mg by mouth 2 (two) times daily.   Yes [provider]  HYDROcodone-acetaminophen (NORCO/VICODIN) 5-325 MG tablet Take 1 tablet by mouth every 6 (six) hours as needed for moderate pain. 11/09/17  Yes Bethann BerkshireZammit, Joseph, MD  HYDROmorphone (DILAUDID) 4 MG tablet Take 1 tablet (4 mg total) every 6 (six) hours as needed by mouth for severe pain. 10/06/17  Yes Bethann BerkshireZammit, Joseph, MD  Insulin Glargine-Lixisenatide (SOLIQUA) 100-33 UNT-MCG/ML SOPN Inject 15 Units into the skin daily.    Yes [provider]  metoCLOPramide (REGLAN) 10 MG tablet Take 1 tablet (10 mg total) by mouth every 6 (six) hours. 10/27/17  Yes Einar Nolasco, Raynelle FanningJulie, PA-C  Multiple Vitamins-Iron (ONE DAILY MULTIVITAMIN/IRON) TABS Take 1 tablet daily by mouth.   Yes [provider]  ondansetron (ZOFRAN ODT) 4 MG disintegrating tablet 4mg  ODT q4 hours prn nausea/vomit 10/16/17  Yes Bethann BerkshireZammit, Joseph, MD  pantoprazole (PROTONIX) 40 MG tablet Take 40 mg daily by mouth. 09/19/17  Yes [provider]  promethazine (PHENERGAN) 25 MG suppository Place 1 suppository (25 mg total) rectally every 6 (six) hours as needed for nausea or vomiting. 11/18/17   Burgess AmorIdol, Marshell Rieger, PA-C  traMADol (ULTRAM) 50 MG tablet Take  1 tablet (50 mg total) by mouth every 6 (six) hours as needed. Patient not taking: Reported on 11/09/2017 10/16/17   Bethann Berkshire, MD    Family History No family history on file.  Social History Social History   Tobacco Use  . Smoking status: Current Every Day Smoker    Packs/day: 1.00    Types: Cigarettes  . Smokeless tobacco: Never Used  Substance Use Topics  . Alcohol use: No  . Drug use: No     Allergies   Ciprofloxacin-ciprofloxacin hcl; Oxycodone-acetaminophen; and Zolpidem   Review of Systems Review of Systems  Constitutional: Negative for chills and fever.    HENT: Negative for congestion and sore throat.   Eyes: Negative.   Respiratory: Negative for chest tightness and shortness of breath.   Cardiovascular: Negative for chest pain.  Gastrointestinal: Positive for abdominal pain and nausea. Negative for constipation, diarrhea and vomiting.  Genitourinary: Negative.   Musculoskeletal: Negative for arthralgias, joint swelling and neck pain.  Skin: Negative.  Negative for rash and wound.  Neurological: Negative for dizziness, weakness, light-headedness, numbness and headaches.  Psychiatric/Behavioral: Negative.      Physical Exam Updated Vital Signs BP 105/63 (BP Location: Left Arm)   Pulse 67   Temp 98.2 F (36.8 C) (Oral)   Resp 15   Ht 5\' 10"  (1.778 m)   Wt 81.6 kg (180 lb)   SpO2 94%   BMI 25.83 kg/m   Physical Exam  Constitutional: He appears well-developed and well-nourished.  HENT:  Head: Normocephalic and atraumatic.  Eyes: Conjunctivae are normal.  Neck: Normal range of motion.  Cardiovascular: Normal rate, regular rhythm, normal heart sounds and intact distal pulses.  Pulmonary/Chest: Effort normal and breath sounds normal. He has no wheezes.  Abdominal: Soft. Bowel sounds are normal. He exhibits no distension. There is tenderness in the epigastric area and left upper quadrant. There is no rigidity and no guarding.  Musculoskeletal: Normal range of motion.  Neurological: He is alert.  Skin: Skin is warm and dry.  Psychiatric: He has a normal mood and affect.  Nursing note and vitals reviewed.    ED Treatments / Results  Labs (all labs ordered are listed, but only abnormal results are displayed)  Results for orders placed or performed during the hospital encounter of 11/18/17  Lipase, blood  Result Value Ref Range   Lipase 39 11 - 51 U/L  Comprehensive metabolic panel  Result Value Ref Range   Sodium 134 (L) 135 - 145 mmol/L   Potassium 4.5 3.5 - 5.1 mmol/L   Chloride 99 (L) 101 - 111 mmol/L   CO2 25 22 - 32  mmol/L   Glucose, Bld 338 (H) 65 - 99 mg/dL   BUN 26 (H) 6 - 20 mg/dL   Creatinine, Ser 1.61 (H) 0.61 - 1.24 mg/dL   Calcium 9.6 8.9 - 09.6 mg/dL   Total Protein 7.5 6.5 - 8.1 g/dL   Albumin 3.8 3.5 - 5.0 g/dL   AST 14 (L) 15 - 41 U/L   ALT 13 (L) 17 - 63 U/L   Alkaline Phosphatase 85 38 - 126 U/L   Total Bilirubin 0.8 0.3 - 1.2 mg/dL   GFR calc non Af Amer >60 >60 mL/min   GFR calc Af Amer >60 >60 mL/min   Anion gap 10 5 - 15  CBC  Result Value Ref Range   WBC 9.1 4.0 - 10.5 K/uL   RBC 4.59 4.22 - 5.81 MIL/uL   Hemoglobin 14.0 13.0 -  17.0 g/dL   HCT 16.142.1 09.639.0 - 04.552.0 %   MCV 91.7 78.0 - 100.0 fL   MCH 30.5 26.0 - 34.0 pg   MCHC 33.3 30.0 - 36.0 g/dL   RDW 40.913.0 81.111.5 - 91.415.5 %   Platelets 238 150 - 400 K/uL  Urinalysis, Routine w reflex microscopic  Result Value Ref Range   Color, Urine STRAW (A) YELLOW   APPearance CLEAR CLEAR   Specific Gravity, Urine 1.010 1.005 - 1.030   pH 6.0 5.0 - 8.0   Glucose, UA >=500 (A) NEGATIVE mg/dL   Hgb urine dipstick NEGATIVE NEGATIVE   Bilirubin Urine NEGATIVE NEGATIVE   Ketones, ur NEGATIVE NEGATIVE mg/dL   Protein, ur NEGATIVE NEGATIVE mg/dL   Nitrite NEGATIVE NEGATIVE   Leukocytes, UA NEGATIVE NEGATIVE   RBC / HPF 0-5 0 - 5 RBC/hpf   WBC, UA 0-5 0 - 5 WBC/hpf   Bacteria, UA NONE SEEN NONE SEEN   Squamous Epithelial / LPF 0-5 (A) NONE SEEN  CBG monitoring, ED  Result Value Ref Range   Glucose-Capillary 317 (H) 65 - 99 mg/dL  CBG monitoring, ED  Result Value Ref Range   Glucose-Capillary 287 (H) 65 - 99 mg/dL      EKG  EKG Interpretation None       Radiology No results found.  Procedures Procedures  (including critical care time)  Medications Ordered in ED Medications  sodium chloride 0.9 % bolus 1,000 mL (0 mLs Intravenous Stopped 11/18/17 2207)  HYDROmorphone (DILAUDID) injection 1 mg (1 mg Intravenous Given 11/18/17 2038)  metoCLOPramide (REGLAN) injection 10 mg (10 mg Intravenous Given 11/18/17 2037)  insulin  aspart (novoLOG) injection 6 Units (6 Units Subcutaneous Given 11/18/17 2059)  promethazine (PHENERGAN) injection 12.5 mg (12.5 mg Intravenous Given 11/18/17 2058)     Initial Impression / Assessment and Plan / ED Course  I have reviewed the triage vital signs and the nursing notes.  Pertinent labs & imaging results that were available during my care of the patient were reviewed by me and considered in my medical decision making (see chart for details).     Patient symptoms resolved with IV fluids, phenergan and Dilaudid 1 mg x1.  He was sent home the last time I saw him with Reglan but he states this did not help sx. Will prescribe phenergan suppositories for prn nausea use.   Strict return precautions discussed, specifically worsening pain, fevers, uncontrolled vomiting.  Reexam of abdomen nonacute with no pain or guarding present.  The patient appears reasonably screened and/or stabilized for discharge and I doubt any other medical condition or other [facility] requiring further screening, evaluation, or treatment in the ED at this time prior to discharge.   Final Clinical Impressions(s) / ED Diagnoses   Final diagnoses:  Chronic abdominal pain  Cyclical vomiting with nausea, intractability of vomiting not specified  Hyperglycemia    ED Discharge Orders        Ordered    promethazine (PHENERGAN) 25 MG suppository  Every 6 hours PRN     11/18/17 2245          Burgess Amordol, Maricela Schreur, PA-C 11/18/17 2301    Benjiman CorePickering, Nathan, MD 11/19/17 [phone]

## 2018-01-20 ENCOUNTER — Emergency Department (HOSPITAL_COMMUNITY)
Admission: EM | Admit: 2018-01-20 | Discharge: 2018-01-20 | Disposition: A | Discharge status: Home / Self Care | Payer: BLUE CROSS/BLUE SHIELD | Attending: Emergency Medicine | Admitting: Emergency Medicine

## 2018-01-20 ENCOUNTER — Other Ambulatory Visit: Payer: Self-pay

## 2018-01-20 ENCOUNTER — Encounter (HOSPITAL_COMMUNITY): Payer: Self-pay | Admitting: Emergency Medicine

## 2018-01-20 DIAGNOSIS — R109 Unspecified abdominal pain: Secondary | ICD-10-CM | POA: Diagnosis not present

## 2018-01-20 DIAGNOSIS — E119 Type 2 diabetes mellitus without complications: Secondary | ICD-10-CM | POA: Insufficient documentation

## 2018-01-20 DIAGNOSIS — G8929 Other chronic pain: Secondary | ICD-10-CM | POA: Insufficient documentation

## 2018-01-20 DIAGNOSIS — R1110 Vomiting, unspecified: Secondary | ICD-10-CM | POA: Diagnosis present

## 2018-01-20 DIAGNOSIS — R1010 Upper abdominal pain, unspecified: Secondary | ICD-10-CM | POA: Insufficient documentation

## 2018-01-20 DIAGNOSIS — F17210 Nicotine dependence, cigarettes, uncomplicated: Secondary | ICD-10-CM | POA: Insufficient documentation

## 2018-01-20 DIAGNOSIS — Z79899 Other long term (current) drug therapy: Secondary | ICD-10-CM | POA: Insufficient documentation

## 2018-01-20 DIAGNOSIS — Z794 Long term (current) use of insulin: Secondary | ICD-10-CM | POA: Insufficient documentation

## 2018-01-20 LAB — URINALYSIS, ROUTINE W REFLEX MICROSCOPIC
BILIRUBIN URINE: NEGATIVE
Bacteria, UA: NONE SEEN
Glucose, UA: NEGATIVE mg/dL
HGB URINE DIPSTICK: NEGATIVE
KETONES UR: 5 mg/dL — AB
Nitrite: NEGATIVE
PH: 5 (ref 5.0–8.0)
Protein, ur: NEGATIVE mg/dL
Specific Gravity, Urine: 1.013 (ref 1.005–1.030)

## 2018-01-20 LAB — COMPREHENSIVE METABOLIC PANEL
ALT: 14 U/L — ABNORMAL LOW (ref 17–63)
ANION GAP: 12 (ref 5–15)
AST: 16 U/L (ref 15–41)
Albumin: 3.8 g/dL (ref 3.5–5.0)
Alkaline Phosphatase: 80 U/L (ref 38–126)
BUN: 18 mg/dL (ref 6–20)
CHLORIDE: 100 mmol/L — AB (ref 101–111)
CO2: 23 mmol/L (ref 22–32)
Calcium: 9.8 mg/dL (ref 8.9–10.3)
Creatinine, Ser: 0.76 mg/dL (ref 0.61–1.24)
GFR calc Af Amer: >60 mL/min (ref >60)
Glucose, Bld: 214 mg/dL — ABNORMAL HIGH (ref 65–99)
POTASSIUM: 3.9 mmol/L (ref 3.5–5.1)
Sodium: 135 mmol/L (ref 135–145)
Total Bilirubin: 1 mg/dL (ref 0.3–1.2)
Total Protein: 7 g/dL (ref 6.5–8.1)

## 2018-01-20 LAB — CBC
HEMATOCRIT: 42.4 % (ref 39.0–52.0)
Hemoglobin: 14.3 g/dL (ref 13.0–17.0)
MCH: 29.7 pg (ref 26.0–34.0)
MCHC: 33.7 g/dL (ref 30.0–36.0)
MCV: 88.1 fL (ref 78.0–100.0)
Platelets: 218 10*3/uL (ref 150–400)
RBC: 4.81 MIL/uL (ref 4.22–5.81)
RDW: 13.5 % (ref 11.5–15.5)
WBC: 7 10*3/uL (ref 4.0–10.5)

## 2018-01-20 LAB — LIPASE, BLOOD: LIPASE: 40 U/L (ref 11–51)

## 2018-01-20 LAB — TROPONIN I: Troponin I: <0.03 ng/mL (ref <0.03)

## 2018-01-20 MED ORDER — SODIUM CHLORIDE 0.9 % IV BOLUS (SEPSIS)
1000.0000 mL | Freq: Once | INTRAVENOUS | Status: AC
  Administered 2018-01-20: 1000 mL via INTRAVENOUS

## 2018-01-20 MED ORDER — PROMETHAZINE HCL 25 MG RE SUPP: 25.0000 mg | Freq: Four times a day (QID) | RECTAL | 0 refills | Status: AC | PRN

## 2018-01-20 MED ORDER — SUCRALFATE 1 G PO TABS: 1.0000 g | ORAL_TABLET | Freq: Three times a day (TID) | ORAL | 0 refills | Status: AC

## 2018-01-20 MED ORDER — ONDANSETRON HCL 4 MG/2ML IJ SOLN
4.0000 mg | Freq: Once | INTRAMUSCULAR | Status: AC
  Administered 2018-01-20: 4 mg via INTRAVENOUS
  Filled 2018-01-20: qty 2

## 2018-01-20 MED ORDER — GI COCKTAIL ~~LOC~~
30.0000 mL | Freq: Once | ORAL | Status: AC
  Administered 2018-01-20: 30 mL via ORAL
  Filled 2018-01-20: qty 30

## 2018-01-20 NOTE — ED Provider Notes (Signed)
[name] EMERGENCY DEPARTMENT Provider Note   CSN: [id number] Arrival date & time: 01/20/18  1505     History   Chief Complaint Chief Complaint  Patient presents with  . Abdominal Pain    HPI [name] is a 57 y.o. male.  HPI Patient has history of chronic abdominal pain.  States he seen multiple specialists with no definitive diagnosis.  States his pain started this morning around 5:30 AM.  No radiation of the pain.  He has had roughly 4 episodes of vomiting.  States he tried to eat lunch and vomited afterwards.  No blood in the vomit or coffee-ground emesis.  No constipation or diarrhea.  No melanotic or grossly bloody stool.  No fever or chills.  No known exacerbating factors.  Patient states he does not drink alcohol.  Occasionally takes Goody powders but none recently.  No sick contacts.  States he has been compliant with his diabetic medication.  Also states he takes Protonix daily.  Past Medical History:  Diagnosis Date  . Arthritis   . Chronic abdominal pain   . Depression   . Diabetes mellitus   . Pancreatitis   . Renal disorder    kidney stones    There are no active problems to display for this patient.   Past Surgical History:  Procedure Laterality Date  . FINGER AMPUTATION    . HERNIA REPAIR    . KNEE SURGERY    . MENISCUS REPAIR     left and right  . TOTAL KNEE ARTHROPLASTY     Right knee  . VARICOSE VEIN SURGERY         Home Medications    Prior to Admission medications   Medication Sig Start Date End Date Taking? Authorizing Provider  dicyclomine (BENTYL) 10 MG capsule Take 1 capsule by mouth 3 (three) times daily as needed. 10/25/17  Yes [provider]  gabapentin (NEURONTIN) 100 MG capsule Take 100 mg by mouth 2 (two) times daily.   Yes [provider]  HUMALOG KWIKPEN 100 UNIT/ML KiwkPen Inject 15 Units into the skin 3 (three) times daily as needed. 12/17/17  Yes [provider]  Insulin Glargine-Lixisenatide  (SOLIQUA) 100-33 UNT-MCG/ML SOPN Inject 15 Units into the skin daily.    Yes [provider]  Multiple Vitamins-Iron (ONE DAILY MULTIVITAMIN/IRON) TABS Take 1 tablet daily by mouth.   Yes [provider]  pantoprazole (PROTONIX) 40 MG tablet Take 40 mg daily by mouth. 09/19/17  Yes [provider]  promethazine (PHENERGAN) 25 MG suppository Place 1 suppository (25 mg total) rectally every 6 (six) hours as needed for nausea or vomiting. 01/20/18   Loren Racer, MD  sucralfate (CARAFATE) 1 g tablet Take 1 tablet (1 g total) by mouth 4 (four) times daily -  with meals and at bedtime. 01/20/18   Loren Racer, MD    Family History No family history on file.  Social History Social History   Tobacco Use  . Smoking status: Current Every Day Smoker    Packs/day: 1.00    Types: Cigarettes  . Smokeless tobacco: Never Used  Substance Use Topics  . Alcohol use: No  . Drug use: No     Allergies   Ciprofloxacin-ciprofloxacin hcl; Oxycodone-acetaminophen; and Zolpidem   Review of Systems Review of Systems  Constitutional: Negative for chills and fever.  HENT: Negative for sore throat and trouble swallowing.   Eyes: Negative for visual disturbance.  Respiratory: Negative for cough and shortness of breath.  Cardiovascular: Negative for chest pain.  Gastrointestinal: Positive for abdominal pain, nausea and vomiting. Negative for blood in stool, constipation and diarrhea.  Genitourinary: Negative for dysuria, flank pain, frequency and hematuria.  Musculoskeletal: Negative for back pain, myalgias and neck pain.  Skin: Negative for rash and wound.  Neurological: Negative for dizziness, weakness, light-headedness, numbness and headaches.  All other systems reviewed and are negative.    Physical Exam Updated Vital Signs BP 107/69 (BP Location: Left Arm)   Pulse 73   Temp 98.5 F (36.9 C) (Oral)   Resp 16   Ht 5\' 10"  (1.778 m)   Wt 86.6 kg (191 lb)   SpO2  97%   BMI 27.41 kg/m   Physical Exam  Constitutional: He is oriented to person, place, and time. He appears well-developed and well-nourished. No distress.  HENT:  Head: Normocephalic and atraumatic.  Mouth/Throat: Oropharynx is clear and moist.  Eyes: EOM are normal. Pupils are equal, round, and reactive to light.  Neck: Normal range of motion. Neck supple.  Cardiovascular: Normal rate and regular rhythm. Exam reveals no gallop and no friction rub.  No murmur heard. Pulmonary/Chest: Effort normal and breath sounds normal. No stridor. No respiratory distress. He has no wheezes. He has no rales. He exhibits no tenderness.  Abdominal: Soft. Bowel sounds are normal. There is tenderness. There is no rebound and no guarding.  Patient with epigastric tenderness to palpation.  No rebound or guarding.  Also has mild left upper quadrant tenderness.  Musculoskeletal: Normal range of motion. He exhibits no edema or tenderness.  No CVA tenderness.  No lower extremity swelling or asymmetry.  Distal pulses are 2+.  Neurological: He is alert and oriented to person, place, and time.  Moves all extremities without focal deficit.  Sensation fully intact.  Skin: Skin is warm and dry. Capillary refill takes less than 2 seconds. No rash noted. He is not diaphoretic. No erythema.  Psychiatric: He has a normal mood and affect. His behavior is normal.  Nursing note and vitals reviewed.    ED Treatments / Results  Labs (all labs ordered are listed, but only abnormal results are displayed) Labs Reviewed  COMPREHENSIVE METABOLIC PANEL - Abnormal; Notable for the following components:      Result Value   Chloride 100 (*)    Glucose, Bld 214 (*)    ALT 14 (*)    All other components within normal limits  URINALYSIS, ROUTINE W REFLEX MICROSCOPIC - Abnormal; Notable for the following components:   Ketones, ur 5 (*)    Leukocytes, UA SMALL (*)    Squamous Epithelial / LPF 0-5 (*)    All other components  within normal limits  LIPASE, BLOOD  CBC  TROPONIN I    EKG  EKG Interpretation None       Radiology No results found.  Procedures Procedures (including critical care time)  Medications Ordered in ED Medications  ondansetron (ZOFRAN) injection 4 mg (4 mg Intravenous Given 01/20/18 1615)  gi cocktail (Maalox,Lidocaine,Donnatal) (30 mLs Oral Given 01/20/18 1615)  sodium chloride 0.9 % bolus 1,000 mL (1,000 mLs Intravenous New Bag/Given 01/20/18 1615)     Initial Impression / Assessment and Plan / ED Course  I have reviewed the triage vital signs and the nursing notes.  Pertinent labs & imaging results that were available during my care of the patient were reviewed by me and considered in my medical decision making (see chart for details).     Review of patient's  recent medical records.  Has had a abdominal ultrasound and CT abdomen and pelvis within the last 6 months.  No abnormalities on either.  Patient had mild elevation in lipase 2 years ago, but since this is been normal.  Question history of pancreatitis.  The patient states that he had upper endoscopy performed by [facility] last year which was normal.  Will treat with a GI cocktail, antiemetics and IV fluids.  Do not believe that imaging is necessary at this point.   Laboratory workup is normal including normal white blood cell count, hemoglobin, LFTs, lipase and electrolytes.  Cardiac workup including EKG and troponin are without abnormality.  Low suspicion for CAD.  Patient received GI cocktail and IV Zofran along with IV fluids.  He had no further vomiting while in the emergency department.  States he continues to have pain but is asking to be discharged home so that he can follow-up with his gastroenterologist. states he is taking his Protonix twice daily and has been compliant with this.  We will also give prescription for Carafate.  Suspect likely gastritis.  Return precautions have been given. Final Clinical Impressions(s) /  ED Diagnoses   Final diagnoses:  Upper abdominal pain    ED Discharge Orders        Ordered    promethazine (PHENERGAN) 25 MG suppository  Every 6 hours PRN     01/20/18 1725    sucralfate (CARAFATE) 1 g tablet  3 times daily with meals & bedtime     01/20/18 1725       Loren RacerYelverton, Shala Baumbach, MD 01/20/18 1725

## 2018-01-20 NOTE — ED Notes (Signed)
Pt has hx of chronic abd pain   [facility]  Here for eval of abd pain 10/10

## 2018-01-20 NOTE — ED Notes (Signed)
Dr Y in to assess 

## 2018-01-20 NOTE — ED Triage Notes (Signed)
Pt reports generalized abdominal pain with n/v that began today. States he has vomited 3-4 times.

## 2018-05-18 IMAGING — DX DG ABDOMEN ACUTE W/ 1V CHEST
3 series · 3 of 3 positions shown · non-contrast
Comparison: None.

CLINICAL DATA: Epigastric pain, history of pancreatitis

EXAM:
DG ABDOMEN ACUTE W/ 1V CHEST

[chest pa]
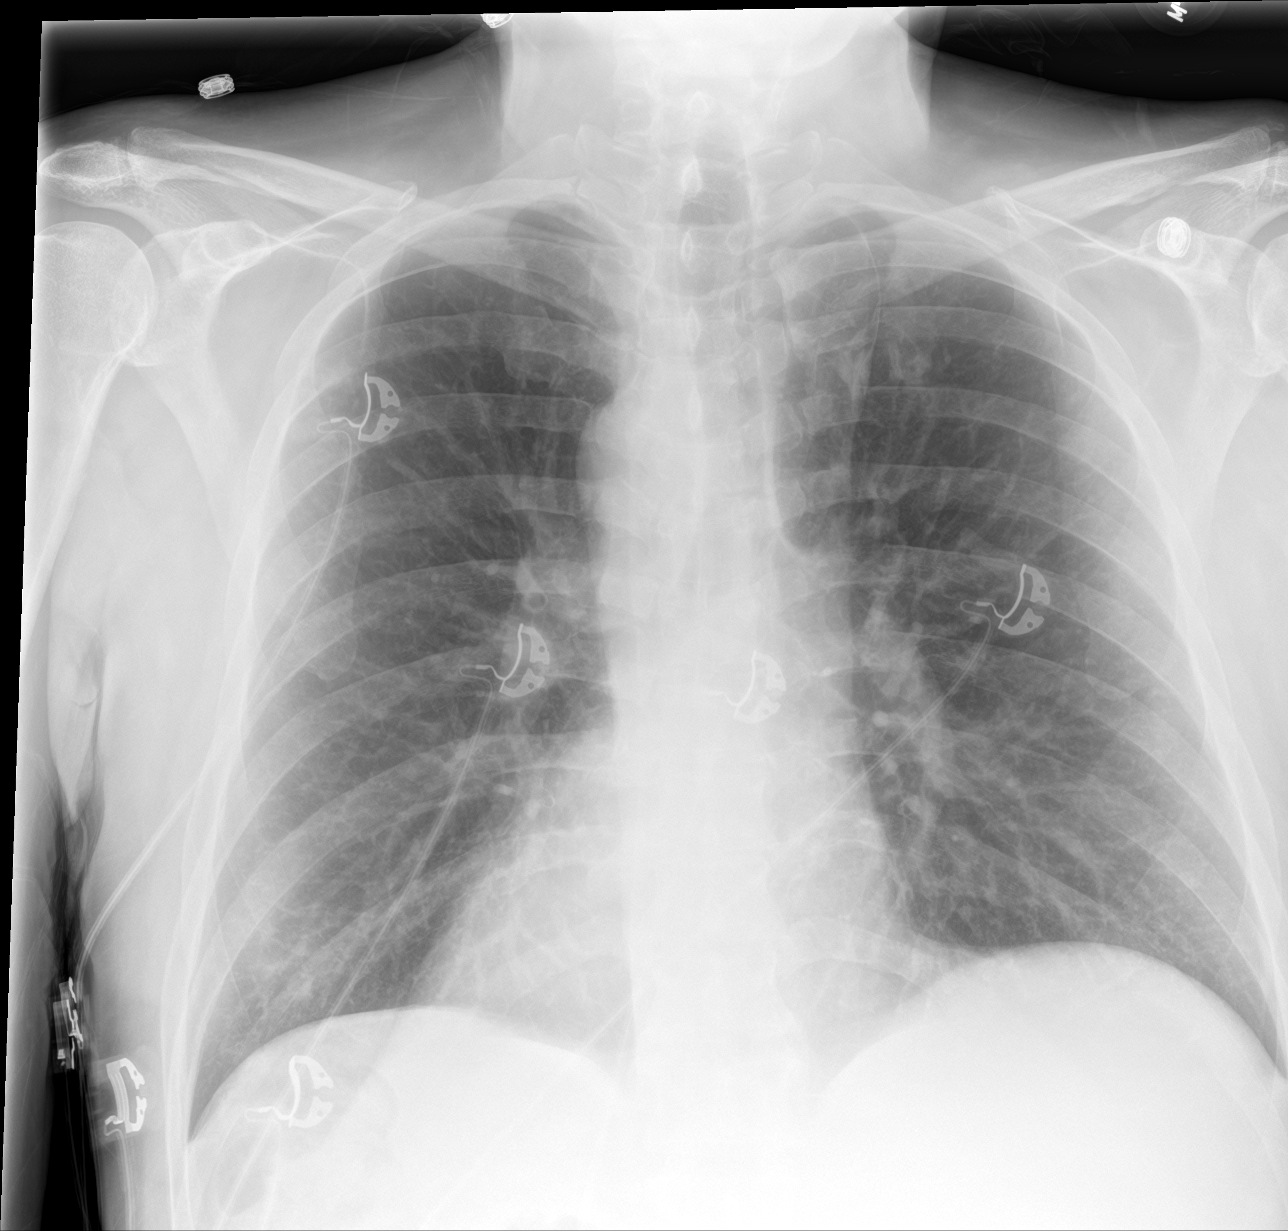

[abdomen erect]
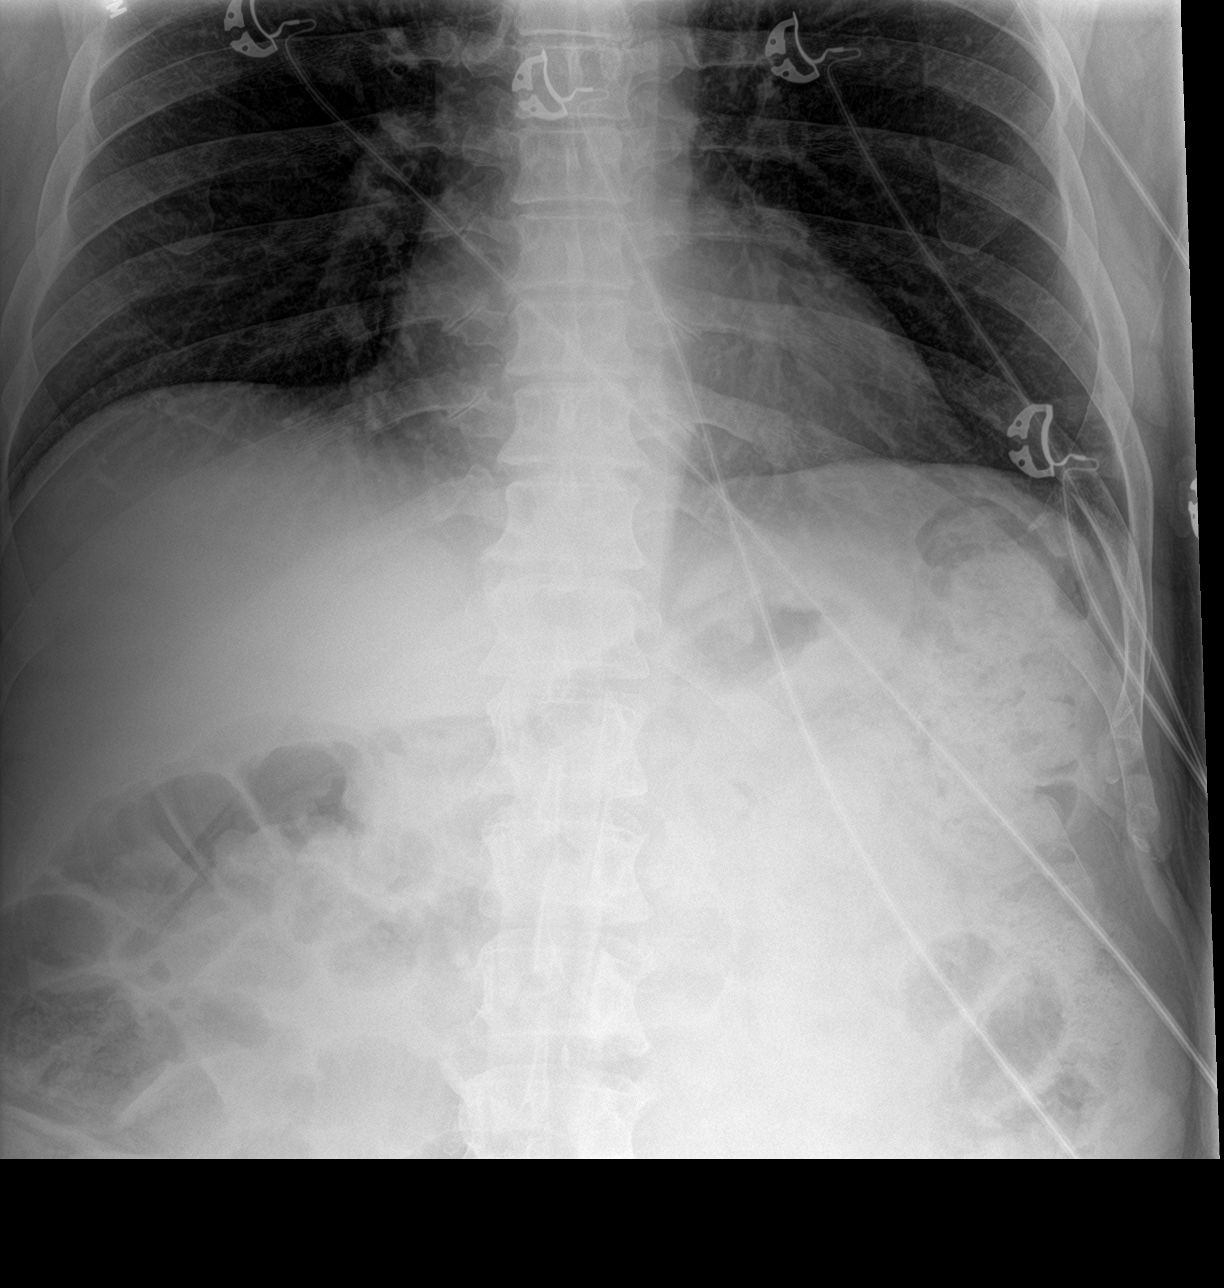

[abdomen supine]
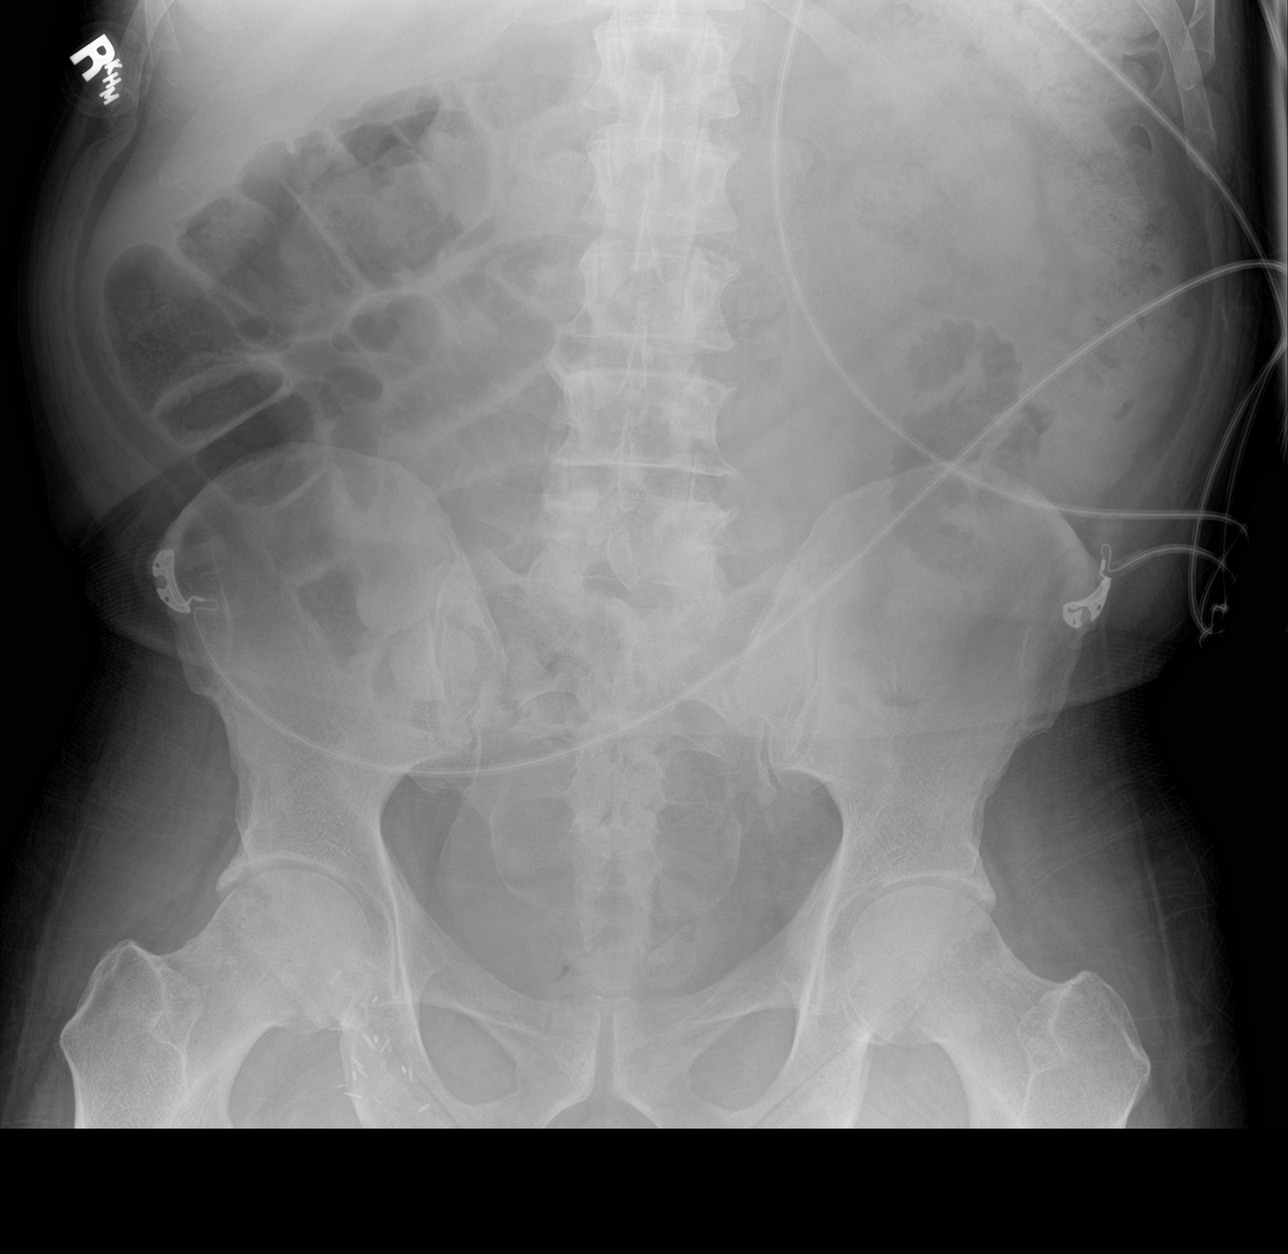

[3 of 3 positions shown; findings below may reference images not displayed]

FINDINGS: Heart size and mediastinal contours are within normal limits.

Lungs are clear. No effusion.

No free air. A few gas filled nondilated small bowel loops in the
right mid abdomen. Colon nondilated. Moderate fecal material in the
proximal descending segment, decompressed distally.

There are no abnormal calcifications. Surgical clips over the right
common femoral vessels.

Regional bones unremarkable.
IMPRESSION: No acute cardiopulmonary disease.

Nonobstructive bowel gas pattern.  No free air.

## 2018-06-02 ENCOUNTER — Emergency Department (HOSPITAL_COMMUNITY): Payer: BLUE CROSS/BLUE SHIELD

## 2018-06-02 ENCOUNTER — Emergency Department (HOSPITAL_COMMUNITY)
Admission: EM | Admit: 2018-06-02 | Discharge: 2018-06-02 | Disposition: A | Discharge status: Home / Self Care | Payer: BLUE CROSS/BLUE SHIELD | Attending: Emergency Medicine | Admitting: Emergency Medicine

## 2018-06-02 ENCOUNTER — Encounter (HOSPITAL_COMMUNITY): Payer: Self-pay | Admitting: Emergency Medicine

## 2018-06-02 DIAGNOSIS — R1030 Lower abdominal pain, unspecified: Secondary | ICD-10-CM | POA: Diagnosis present

## 2018-06-02 DIAGNOSIS — Z79899 Other long term (current) drug therapy: Secondary | ICD-10-CM | POA: Diagnosis not present

## 2018-06-02 DIAGNOSIS — F17210 Nicotine dependence, cigarettes, uncomplicated: Secondary | ICD-10-CM | POA: Diagnosis not present

## 2018-06-02 DIAGNOSIS — Z794 Long term (current) use of insulin: Secondary | ICD-10-CM | POA: Diagnosis not present

## 2018-06-02 DIAGNOSIS — E119 Type 2 diabetes mellitus without complications: Secondary | ICD-10-CM | POA: Insufficient documentation

## 2018-06-02 DIAGNOSIS — Z96651 Presence of right artificial knee joint: Secondary | ICD-10-CM | POA: Insufficient documentation

## 2018-06-02 DIAGNOSIS — R1013 Epigastric pain: Secondary | ICD-10-CM

## 2018-06-02 LAB — COMPREHENSIVE METABOLIC PANEL
ALK PHOS: 69 U/L (ref 38–126)
ALT: 20 U/L (ref 0–44)
ANION GAP: 9 (ref 5–15)
AST: 18 U/L (ref 15–41)
Albumin: 4 g/dL (ref 3.5–5.0)
BILIRUBIN TOTAL: 1 mg/dL (ref 0.3–1.2)
BUN: 15 mg/dL (ref 6–20)
CALCIUM: 9.9 mg/dL (ref 8.9–10.3)
CO2: 29 mmol/L (ref 22–32)
Chloride: 99 mmol/L (ref 98–111)
Creatinine, Ser: 1.06 mg/dL (ref 0.61–1.24)
GFR calc non Af Amer: >60 mL/min (ref >60)
GLUCOSE: 219 mg/dL — AB (ref 70–99)
Potassium: 3.7 mmol/L (ref 3.5–5.1)
Sodium: 137 mmol/L (ref 135–145)
TOTAL PROTEIN: 7.3 g/dL (ref 6.5–8.1)

## 2018-06-02 LAB — LIPASE, BLOOD: Lipase: 36 U/L (ref 11–51)

## 2018-06-02 LAB — CBC
HCT: 42 % (ref 39.0–52.0)
HEMOGLOBIN: 14.9 g/dL (ref 13.0–17.0)
MCH: 32.3 pg (ref 26.0–34.0)
MCHC: 35.5 g/dL (ref 30.0–36.0)
MCV: 91.1 fL (ref 78.0–100.0)
Platelets: 203 10*3/uL (ref 150–400)
RBC: 4.61 MIL/uL (ref 4.22–5.81)
RDW: 12.4 % (ref 11.5–15.5)
WBC: 8 10*3/uL (ref 4.0–10.5)

## 2018-06-02 MED ORDER — PANTOPRAZOLE SODIUM 40 MG PO TBEC: 40.0000 mg | DELAYED_RELEASE_TABLET | Freq: Every day | ORAL | 0 refills | Status: AC

## 2018-06-02 MED ORDER — IOPAMIDOL (ISOVUE-300) INJECTION 61%
100.0000 mL | Freq: Once | INTRAVENOUS | Status: AC | PRN
  Administered 2018-06-02: 100 mL via INTRAVENOUS

## 2018-06-02 MED ORDER — ONDANSETRON HCL 4 MG/2ML IJ SOLN
4.0000 mg | Freq: Once | INTRAMUSCULAR | Status: AC
  Administered 2018-06-02: 4 mg via INTRAVENOUS
  Filled 2018-06-02: qty 2

## 2018-06-02 MED ORDER — SODIUM CHLORIDE 0.9 % IV BOLUS
1000.0000 mL | Freq: Once | INTRAVENOUS | Status: AC
  Administered 2018-06-02: 1000 mL via INTRAVENOUS

## 2018-06-02 MED ORDER — HYDROMORPHONE HCL 1 MG/ML IJ SOLN
1.0000 mg | Freq: Once | INTRAMUSCULAR | Status: AC
  Administered 2018-06-02: 1 mg via INTRAVENOUS
  Filled 2018-06-02: qty 1

## 2018-06-02 MED ORDER — TRAMADOL HCL 50 MG PO TABS: 50.0000 mg | ORAL_TABLET | Freq: Four times a day (QID) | ORAL | 0 refills | Status: AC | PRN

## 2018-06-02 MED ORDER — FAMOTIDINE IN NACL 20-0.9 MG/50ML-% IV SOLN
20.0000 mg | Freq: Once | INTRAVENOUS | Status: AC
  Administered 2018-06-02: 20 mg via INTRAVENOUS
  Filled 2018-06-02: qty 50

## 2018-06-02 NOTE — ED Notes (Signed)
Patient c/o pain increasing again. Per patient pain is now a 8/10. EDP made aware.

## 2018-06-02 NOTE — ED Notes (Signed)
x2 unsuccessful IV attempts made. Vic BlackbirdAmanda Crews to room to attempt IV access.

## 2018-06-02 NOTE — Discharge Instructions (Signed)
CT scan showed no obvious abnormalities.  Prescription for pain medicine and ulcer medicine.  Follow-up with your GI doctor this week.

## 2018-06-02 NOTE — ED Triage Notes (Signed)
Pt states that he has been having abd pain since around 0430 this morning. He has been having vomiting with this.

## 2018-06-02 NOTE — ED Provider Notes (Signed)
[facility] EMERGENCY DEPARTMENT Provider Note   CSN: [id number] Arrival date & time: 06/02/18  1218     History   Chief Complaint Chief Complaint  Patient presents with  . Abdominal Pain    HPI [name] is a 57 y.o. male.  Epigastric pain since 0430 today.  He is scheduled to see a gastroenterologist tomorrow in [place].  Past medical history includes diabetes and pancreatitis.  Decreased oral intake.  No vomiting or diarrhea, fever, sweats, chills, chest pain, dyspnea, jaundice.  He is taking nothing for the pain.  Severity is moderate.     Past Medical History:  Diagnosis Date  . Arthritis   . Chronic abdominal pain   . Depression   . Diabetes mellitus   . Pancreatitis   . Renal disorder    kidney stones    There are no active problems to display for this patient.   Past Surgical History:  Procedure Laterality Date  . FINGER AMPUTATION    . HERNIA REPAIR    . KNEE SURGERY    . MENISCUS REPAIR     left and right  . TOTAL KNEE ARTHROPLASTY     Right knee  . VARICOSE VEIN SURGERY          Home Medications    Prior to Admission medications   Medication Sig Start Date End Date Taking? Authorizing Provider  dicyclomine (BENTYL) 10 MG capsule Take 1 capsule by mouth 3 (three) times daily as needed (abdominal pain).  10/25/17  Yes [provider]  HUMALOG KWIKPEN 100 UNIT/ML KiwkPen Inject 4-16 Units into the skin 3 (three) times daily as needed (SLIDING SCALE).  12/17/17  Yes [provider]  metoCLOPramide (REGLAN) 10 MG tablet Take 10 mg by mouth 4 (four) times daily as needed for nausea.   Yes [provider]  nitrofurantoin, macrocrystal-monohydrate, (MACROBID) 100 MG capsule Take 100 mg by mouth 2 (two) times daily. Starting 05/24/2018 x 7 days.   Yes [provider]  ondansetron (ZOFRAN) 4 MG tablet Take 4 mg by mouth every 6 (six) hours as needed for nausea or vomiting.   Yes [provider]    sucralfate (CARAFATE) 1 g tablet Take 1 tablet (1 g total) by mouth 4 (four) times daily -  with meals and at bedtime. 01/20/18  Yes Loren Racer, MD  pantoprazole (PROTONIX) 40 MG tablet Take 1 tablet (40 mg total) by mouth daily. 06/02/18   Donnetta Hutching, MD  promethazine (PHENERGAN) 25 MG suppository Place 1 suppository (25 mg total) rectally every 6 (six) hours as needed for nausea or vomiting. 01/20/18   Loren Racer, MD  traMADol (ULTRAM) 50 MG tablet Take 1 tablet (50 mg total) by mouth every 6 (six) hours as needed. 06/02/18   Donnetta Hutching, MD    Family History No family history on file.  Social History Social History   Tobacco Use  . Smoking status: Current Every Day Smoker    Packs/day: 1.00    Types: Cigarettes  . Smokeless tobacco: Never Used  Substance Use Topics  . Alcohol use: No  . Drug use: No     Allergies   Ciprofloxacin-ciprofloxacin hcl; Oxycodone-acetaminophen; and Zolpidem   Review of Systems Review of Systems  All other systems reviewed and are negative.    Physical Exam Updated Vital Signs BP 134/77 (BP Location: Left Arm)   Pulse 66   Temp 99.3 F (37.4 C) (Oral)   Resp 16   Ht 5'  10" (1.778 m)   Wt 85.3 kg (188 lb)   SpO2 98%   BMI 26.98 kg/m   Physical Exam  Constitutional: He is oriented to person, place, and time. He appears well-developed and well-nourished.  HENT:  Head: Normocephalic and atraumatic.  Eyes: Conjunctivae are normal.  Neck: Neck supple.  Cardiovascular: Normal rate and regular rhythm.  Pulmonary/Chest: Effort normal and breath sounds normal.  Abdominal: Soft. Bowel sounds are normal.  Minimal epigastric tenderness  Musculoskeletal: Normal range of motion.  Neurological: He is alert and oriented to person, place, and time.  Skin: Skin is warm and dry.  Psychiatric: He has a normal mood and affect. His behavior is normal.  Nursing note and vitals reviewed.    ED Treatments / Results  Labs (all labs ordered  are listed, but only abnormal results are displayed) Labs Reviewed  COMPREHENSIVE METABOLIC PANEL - Abnormal; Notable for the following components:      Result Value   Glucose, Bld 219 (*)    All other components within normal limits  LIPASE, BLOOD  CBC  URINALYSIS, ROUTINE W REFLEX MICROSCOPIC    EKG None  Radiology Ct Abdomen Pelvis W Contrast  Result Date: 06/02/2018 CLINICAL DATA:  Per ED note. Pt states that he has been having abd pain since around 0430 this morning. He has been having vomiting with this. Hx of diabetes,pancreatitis, kidney stones, hernia repair. EXAM: CT ABDOMEN AND PELVIS WITH CONTRAST TECHNIQUE: Multidetector CT imaging of the abdomen and pelvis was performed using the standard protocol following bolus administration of intravenous contrast. CONTRAST:  ISOVUE-300 IOPAMIDOL (ISOVUE-300) INJECTION 61% COMPARISON:  CT of the abdomen and pelvis on 09/25/2017 FINDINGS: Lower chest: There is minimal bibasilar atelectasis. Heart size is normal. There is minimal coronary artery calcification. Hepatobiliary: No focal liver abnormality is seen. No radiopaque gallstones, biliary dilatation, or pericholecystic inflammatory changes. Pancreas: Unremarkable. No pancreatic ductal dilatation or surrounding inflammatory changes. Spleen: Normal in size without focal abnormality. Adrenals/Urinary Tract: Adrenal glands are normal. There are bilateral renal cysts. No suspicious renal mass. No hydronephrosis or ureteral obstruction. Urinary bladder is unremarkable. Stomach/Bowel: The stomach and small bowel loops are normal in appearance. The appendix is well seen and has a normal appearance. There is moderate stool burden throughout normal appearing loops of colon. There are scattered colonic diverticula but no acute diverticulitis. Vascular/Lymphatic: There is dense atherosclerosis of the abdominal aorta not associated with aneurysm. Although involved by atherosclerosis, there is vascular  opacification of the celiac axis, superior mesenteric artery, and inferior mesenteric artery. Normal appearance of the portal venous system and inferior vena cava. No retroperitoneal or mesenteric adenopathy. Reproductive: Prostate is unremarkable. Other: There is a small fat containing paraumbilical hernia. There is fat within the RIGHT inguinal canal. No ascites. Surgical clips are identified in the RIGHT inguinal region. Musculoskeletal: No acute or significant osseous findings. IMPRESSION: 1.  No evidence for acute  abnormality. 2.  Aortic atherosclerosis.  (ICD10-I70.0) 3. Colonic diverticulosis.  Moderate stool burden. 4. Minimal atherosclerotic calcification of the coronary arteries. 5. Bilateral renal cysts. 6. Normal appendix. 7. Small fat containing paraumbilical hernia. 8. Fat within the RIGHT inguinal canal. Electronically Signed   By: Norva Pavlov M.D.   On: 06/02/2018 15:35    Procedures Procedures (including critical care time)  Medications Ordered in ED Medications  famotidine (PEPCID) IVPB 20 mg premix (20 mg Intravenous New Bag/Given 06/02/18 1605)  ondansetron (ZOFRAN) injection 4 mg (4 mg Intravenous Given 06/02/18 1432)  sodium chloride 0.9 %  bolus 1,000 mL (1,000 mLs Intravenous New Bag/Given 06/02/18 1432)  HYDROmorphone (DILAUDID) injection 1 mg (1 mg Intravenous Given 06/02/18 1432)  iopamidol (ISOVUE-300) 61 % injection 100 mL (100 mLs Intravenous Contrast Given 06/02/18 1506)  HYDROmorphone (DILAUDID) injection 1 mg (1 mg Intravenous Given 06/02/18 1600)  ondansetron (ZOFRAN) injection 4 mg (4 mg Intravenous Given 06/02/18 1600)     Initial Impression / Assessment and Plan / ED Course  I have reviewed the triage vital signs and the nursing notes.  Pertinent labs & imaging results that were available during my care of the patient were reviewed by me and considered in my medical decision making (see chart for details).     Patient presents with epigastric pain.  Liver  functions and lipase normal.  CT scan shows no acute findings.  Patient has follow-up with gastroenterology tomorrow.  Discharge medications Ultram 50 mg and Protonix 40 mg.  Final Clinical Impressions(s) / ED Diagnoses   Final diagnoses:  Epigastric pain    ED Discharge Orders        Ordered    traMADol (ULTRAM) 50 MG tablet  Every 6 hours PRN     06/02/18 1616    pantoprazole (PROTONIX) 40 MG tablet  Daily     06/02/18 1616       Donnetta Hutching[place], Kailah Pennel, MD 06/04/18 [phone]

## 2019-04-22 ENCOUNTER — Emergency Department (HOSPITAL_COMMUNITY)
Admission: EM | Admit: 2019-04-22 | Discharge: 2019-04-22 | Disposition: A | Discharge status: Home / Self Care | Payer: BC Managed Care – PPO | Attending: Emergency Medicine | Admitting: Emergency Medicine

## 2019-04-22 ENCOUNTER — Encounter (HOSPITAL_COMMUNITY): Payer: Self-pay

## 2019-04-22 ENCOUNTER — Other Ambulatory Visit: Payer: Self-pay

## 2019-04-22 ENCOUNTER — Emergency Department (HOSPITAL_COMMUNITY): Payer: BC Managed Care – PPO

## 2019-04-22 DIAGNOSIS — M25562 Pain in left knee: Secondary | ICD-10-CM | POA: Insufficient documentation

## 2019-04-22 DIAGNOSIS — Z5321 Procedure and treatment not carried out due to patient leaving prior to being seen by health care provider: Secondary | ICD-10-CM | POA: Insufficient documentation

## 2019-04-22 NOTE — ED Triage Notes (Signed)
Pt presents to ED with complaints of left knee pain. Pt states he felt like it "went out on him" Pt denies fall.

## 2020-02-19 DEATH — deceased
